# Patient Record
Sex: Female | Born: 2000 | Race: Black or African American | Hispanic: No | Marital: Single | Smoking: Former smoker
Health system: Southern US, Community
[De-identification: ages and names within clinical notes are randomized; demographics above are authoritative.]

## PROBLEM LIST (undated history)

## (undated) DIAGNOSIS — K3184 Gastroparesis: Secondary | ICD-10-CM

## (undated) DIAGNOSIS — N809 Endometriosis, unspecified: Secondary | ICD-10-CM

## (undated) DIAGNOSIS — F419 Anxiety disorder, unspecified: Secondary | ICD-10-CM

## (undated) DIAGNOSIS — R1115 Cyclical vomiting syndrome unrelated to migraine: Secondary | ICD-10-CM

## (undated) DIAGNOSIS — K219 Gastro-esophageal reflux disease without esophagitis: Secondary | ICD-10-CM

## (undated) DIAGNOSIS — F32A Depression, unspecified: Secondary | ICD-10-CM

## (undated) HISTORY — DX: Gastroparesis: K31.84

## (undated) HISTORY — DX: Cyclical vomiting syndrome unrelated to migraine: R11.15

## (undated) HISTORY — DX: Gastro-esophageal reflux disease without esophagitis: K21.9

## (undated) HISTORY — DX: Anxiety disorder, unspecified: F41.9

## (undated) HISTORY — DX: Morbid (severe) obesity due to excess calories: E66.01

## (undated) HISTORY — PX: CHOLECYSTECTOMY: SHX55

## (undated) HISTORY — DX: Endometriosis, unspecified: N80.9

## (undated) HISTORY — DX: Depression, unspecified: F32.A

---

## 2020-04-27 ENCOUNTER — Other Ambulatory Visit: Payer: Self-pay

## 2020-04-27 ENCOUNTER — Emergency Department (HOSPITAL_COMMUNITY)
Admission: EM | Admit: 2020-04-27 | Discharge: 2020-04-28 | Disposition: A | Discharge status: Home / Self Care | Payer: Medicaid Other | Attending: Emergency Medicine | Admitting: Emergency Medicine

## 2020-04-27 ENCOUNTER — Encounter (HOSPITAL_COMMUNITY): Payer: Self-pay | Admitting: Pediatrics

## 2020-04-27 DIAGNOSIS — R509 Fever, unspecified: Secondary | ICD-10-CM | POA: Diagnosis not present

## 2020-04-27 DIAGNOSIS — R5383 Other fatigue: Secondary | ICD-10-CM | POA: Diagnosis not present

## 2020-04-27 DIAGNOSIS — R112 Nausea with vomiting, unspecified: Secondary | ICD-10-CM | POA: Insufficient documentation

## 2020-04-27 LAB — URINALYSIS, ROUTINE W REFLEX MICROSCOPIC
Glucose, UA: 50 mg/dL — AB
Hgb urine dipstick: NEGATIVE
Ketones, ur: 5 mg/dL — AB
Leukocytes,Ua: NEGATIVE
Nitrite: NEGATIVE
Protein, ur: 100 mg/dL — AB
Specific Gravity, Urine: 1.03 (ref 1.005–1.030)
pH: 5 (ref 5.0–8.0)

## 2020-04-27 LAB — COMPREHENSIVE METABOLIC PANEL
ALT: 202 U/L — ABNORMAL HIGH (ref 0–44)
AST: 80 U/L — ABNORMAL HIGH (ref 15–41)
Albumin: 4.2 g/dL (ref 3.5–5.0)
Alkaline Phosphatase: 92 U/L (ref 38–126)
Anion gap: 15 (ref 5–15)
BUN: 6 mg/dL (ref 6–20)
CO2: 28 mmol/L (ref 22–32)
Calcium: 10 mg/dL (ref 8.9–10.3)
Chloride: 95 mmol/L — ABNORMAL LOW (ref 98–111)
Creatinine, Ser: 0.92 mg/dL (ref 0.44–1.00)
GFR, Estimated: >60 mL/min (ref >60)
Glucose, Bld: 120 mg/dL — ABNORMAL HIGH (ref 70–99)
Potassium: 2.9 mmol/L — ABNORMAL LOW (ref 3.5–5.1)
Sodium: 138 mmol/L (ref 135–145)
Total Bilirubin: 1.8 mg/dL — ABNORMAL HIGH (ref 0.3–1.2)
Total Protein: 8.3 g/dL — ABNORMAL HIGH (ref 6.5–8.1)

## 2020-04-27 LAB — CBC
HCT: 51.2 % — ABNORMAL HIGH (ref 36.0–46.0)
Hemoglobin: 16.8 g/dL — ABNORMAL HIGH (ref 12.0–15.0)
MCH: 31 pg (ref 26.0–34.0)
MCHC: 32.8 g/dL (ref 30.0–36.0)
MCV: 94.5 fL (ref 80.0–100.0)
Platelets: 313 10*3/uL (ref 150–400)
RBC: 5.42 MIL/uL — ABNORMAL HIGH (ref 3.87–5.11)
RDW: 13.6 % (ref 11.5–15.5)
WBC: 8.9 10*3/uL (ref 4.0–10.5)
nRBC: 0 % (ref 0.0–0.2)

## 2020-04-27 LAB — LIPASE, BLOOD: Lipase: 19 U/L (ref 11–51)

## 2020-04-27 LAB — I-STAT BETA HCG BLOOD, ED (MC, WL, AP ONLY): I-stat hCG, quantitative: <5 m[IU]/mL (ref <5)

## 2020-04-27 NOTE — ED Triage Notes (Signed)
C/o vomitingx 3 weeks, unable to keep anything even water along w/ abdominal pain.  Reported hx of cyclic vomiting, diverticulitis, gastritis, endometriosis and fibromyalgia.

## 2020-04-28 ENCOUNTER — Encounter (HOSPITAL_COMMUNITY): Payer: Self-pay | Admitting: Student

## 2020-04-28 LAB — RAPID URINE DRUG SCREEN, HOSP PERFORMED
Amphetamines: NOT DETECTED
Barbiturates: NOT DETECTED
Benzodiazepines: NOT DETECTED
Cocaine: NOT DETECTED
Opiates: NOT DETECTED
Tetrahydrocannabinol: NOT DETECTED

## 2020-04-28 LAB — MAGNESIUM: Magnesium: 2 mg/dL (ref 1.7–2.4)

## 2020-04-28 MED ORDER — SODIUM CHLORIDE 0.9 % IV BOLUS
1000.0000 mL | Freq: Once | INTRAVENOUS | Status: AC
  Administered 2020-04-28: 1000 mL via INTRAVENOUS

## 2020-04-28 MED ORDER — POTASSIUM CHLORIDE CRYS ER 20 MEQ PO TBCR
40.0000 meq | EXTENDED_RELEASE_TABLET | Freq: Once | ORAL | Status: DC
  Filled 2020-04-28: qty 2

## 2020-04-28 MED ORDER — DICYCLOMINE HCL 10 MG PO CAPS
10.0000 mg | ORAL_CAPSULE | Freq: Once | ORAL | Status: AC
  Administered 2020-04-28: 10 mg via ORAL
  Filled 2020-04-28: qty 1

## 2020-04-28 MED ORDER — ONDANSETRON HCL 4 MG/2ML IJ SOLN
4.0000 mg | Freq: Once | INTRAMUSCULAR | Status: AC
  Administered 2020-04-28: 4 mg via INTRAVENOUS
  Filled 2020-04-28: qty 2

## 2020-04-28 MED ORDER — PANTOPRAZOLE SODIUM 40 MG PO TBEC: 40.0000 mg | DELAYED_RELEASE_TABLET | Freq: Every day | ORAL | 0 refills | Status: DC

## 2020-04-28 MED ORDER — POTASSIUM CHLORIDE 40 MEQ/15ML (20%) PO SOLN
15.0000 mL | Freq: Every day | ORAL | 0 refills | Status: DC
Start: 2020-04-28 — End: 2021-03-21

## 2020-04-28 MED ORDER — FAMOTIDINE IN NACL 20-0.9 MG/50ML-% IV SOLN
20.0000 mg | Freq: Once | INTRAVENOUS | Status: AC
  Administered 2020-04-28: 20 mg via INTRAVENOUS
  Filled 2020-04-28: qty 50

## 2020-04-28 MED ORDER — POTASSIUM CHLORIDE 10 MEQ/100ML IV SOLN
10.0000 meq | INTRAVENOUS | Status: AC
  Administered 2020-04-28 (×2): 10 meq via INTRAVENOUS
  Filled 2020-04-28 (×2): qty 100

## 2020-04-28 MED ORDER — DICYCLOMINE HCL 20 MG PO TABS: 20.0000 mg | ORAL_TABLET | Freq: Three times a day (TID) | ORAL | 0 refills | Status: DC | PRN

## 2020-04-28 MED ORDER — DROPERIDOL 2.5 MG/ML IJ SOLN
1.2500 mg | Freq: Once | INTRAMUSCULAR | Status: AC
  Administered 2020-04-28: 1.25 mg via INTRAVENOUS
  Filled 2020-04-28: qty 2

## 2020-04-28 MED ORDER — SUCRALFATE 1 GM/10ML PO SUSP: 1.0000 g | Freq: Three times a day (TID) | ORAL | 0 refills | Status: DC

## 2020-04-28 NOTE — ED Notes (Signed)
Pt was given a cup of ice water she only took a few sips before dry heaving and states she is not able to drink anymore water at this time.

## 2020-04-28 NOTE — ED Provider Notes (Signed)
[facility] EMERGENCY DEPARTMENT Provider Note   CSN: [id number] Arrival date & time: 04/27/20  1744     History Chief Complaint  Patient presents with  . Emesis    Glenda Kim is a 19 y.o. female with a history of cyclic vomiting syndrome, gastritis, endometriosis, and fibromyalgia who presents to the ED with complaints of N/V x 3 weeks. Patient states she has had daily vomiting x 3 weeks, unable to keep down food, keeping down some liquids.  Reports associated abdominal pain, fatigue, subjective fevers, & chills. Pain is located in the right side of the abdomen, feels like spasms that are alleviated some with cool liquid intake, otherwise without alleviating or aggravating factors. Has been trying zofran & reglan without much relief. Last BM was 4 days prior. Denies hematemesis, melena, hematochezia, dysuria, vaginal bleeding, or vaginal discharge.  She states she has a history of similar symptoms, she has required admission in the past.  She states she has  had her gallbladder removed and has had multiple endoscopies.  She denies marijuana use.  Her symptoms feel similar to her most recent ED visit at [facility] 04/21/2020.  She states that usually fluids and IV Zofran help her sxs.    HPI     History reviewed. No pertinent past medical history.  There are no problems to display for this patient.   History reviewed. No pertinent surgical history.   OB History   No obstetric history on file.     No family history on file.  Social History   Tobacco Use  . Smoking status: Not on file  Substance Use Topics  . Alcohol use: Not on file  . Drug use: Not on file    Home Medications Prior to Admission medications   Not on File    Allergies    Patient has no known allergies.  Review of Systems   Review of Systems  Constitutional: Positive for chills, fatigue and fever.  Respiratory: Negative for shortness of breath.   Cardiovascular: Negative for  chest pain.  Gastrointestinal: Positive for abdominal pain, nausea and vomiting. Negative for blood in stool and constipation.  Genitourinary: Negative for dysuria.  Neurological: Negative for syncope.  All other systems reviewed and are negative.   Physical Exam Updated Vital Signs BP 139/62 (BP Location: Right Arm)   Pulse 99   Temp 98.5 F (36.9 C) (Oral)   Resp 18   SpO2 100%   Physical Exam Vitals and nursing note reviewed.  Constitutional:      General: She is not in acute distress.    Appearance: She is well-developed. She is obese. She is not toxic-appearing.  HENT:     Head: Normocephalic and atraumatic.  Eyes:     General:        Right eye: No discharge.        Left eye: No discharge.     Conjunctiva/sclera: Conjunctivae normal.  Cardiovascular:     Rate and Rhythm: Normal rate and regular rhythm.  Pulmonary:     Effort: Pulmonary effort is normal. No respiratory distress.     Breath sounds: Normal breath sounds. No wheezing, rhonchi or rales.  Abdominal:     General: There is no distension.     Palpations: Abdomen is soft.     Tenderness: There is no abdominal tenderness. There is no guarding.     Comments: Generalized worse in the RUQ  Musculoskeletal:     Cervical back: Neck supple.  Skin:    General: Skin is warm and dry.     Findings: No rash.  Neurological:     Mental Status: She is alert.     Comments: Clear speech.   Psychiatric:        Behavior: Behavior normal.    ED Results / Procedures / Treatments   Labs (all labs ordered are listed, but only abnormal results are displayed) Labs Reviewed  COMPREHENSIVE METABOLIC PANEL - Abnormal; Notable for the following components:      Result Value   Potassium 2.9 (*)    Chloride 95 (*)    Glucose, Bld 120 (*)    Total Protein 8.3 (*)    AST 80 (*)    ALT 202 (*)    Total Bilirubin 1.8 (*)    All other components within normal limits  CBC - Abnormal; Notable for the following components:   RBC  5.42 (*)    Hemoglobin 16.8 (*)    HCT 51.2 (*)    All other components within normal limits  URINALYSIS, ROUTINE W REFLEX MICROSCOPIC - Abnormal; Notable for the following components:   Color, Urine AMBER (*)    APPearance TURBID (*)    Glucose, UA 50 (*)    Bilirubin Urine MODERATE (*)    Ketones, ur 5 (*)    Protein, ur 100 (*)    Bacteria, UA RARE (*)    All other components within normal limits  LIPASE, BLOOD  MAGNESIUM  RAPID URINE DRUG SCREEN, HOSP PERFORMED  I-STAT BETA HCG BLOOD, ED ([facility], [facility], [facility] ONLY)    EKG EKG Interpretation  Date/Time:  Wednesday April 28 2020 02:38:30 EDT Ventricular Rate:  78 PR Interval:    QRS Duration: 91 QT Interval:  390 QTC Calculation: 445 R Axis:   54 Text Interpretation: Sinus rhythm Consider left atrial enlargement No old tracing to compare Confirmed by Melene Plan [phone]) on 04/28/2020 3:03:51 AM   Radiology No results found.  Procedures Procedures (including critical care time)  Medications Ordered in ED Medications - No data to display  ED Course  I have reviewed the triage vital signs and the nursing notes.  Pertinent labs & imaging results that were available during my care of the patient were reviewed by me and considered in my medical decision making (see chart for details).    MDM Rules/Calculators/A&P                         Patient presents to the ED with complaints of N/V & abdominal pain x 3 weeks.  Patient is nontoxic, her initial tachycardia normalized on my assessment.  She has generalized abdominal tenderness that seems most prominent in the right upper quadrant.  Additional history obtained:  Additional history obtained from chart review nursing note reviewed.. Previous records obtained and reviewed:  Per chart review patient seen at [facility] emergency department 03/2006/21 with similar symptoms, had a CT abdomen/pelvis with contrast that did not show any acute process.  She was also seen in the [facility]  emergency department in October as well-having some difficulty reviewing these specific records.  The last gastroenterology note in care everywhere appears to be from July 2020, this was a follow-up after recent admission for cyclic vomiting syndrome, patient has documented gastroparesis with 0% of her stomach contents of doing after 90 minutes.  She had an EGD about a year prior to this visit that showed severe esophagitis with comorbid gastritis.  Lab Tests:  I reviewed and interpreted labs, which included:  CBC: Fairly unremarkable CMP: Transaminitis and mild elevation in total bilirubin which are mildly worsened compared to prior but have been elevated in the past.  Hypokalemia at 2.9. Lipase: Within normal limits Urinalysis: Contaminated, no urinary symptoms, doubt UTI. UDS: Negative Pregnancy test: Negative Magnesium: Within normal limits  EKG: QTC 445  There was some concern from patient's significant other that she was having a seizure, patient was dry heaving some with increased salivation and appeared a bit shaky, no witnessed seizure activity.  Given patient with same symptoms with recent CT imaging and no peritoneal signs today do not feel that further imaging is necessary in the emergency department at this time. Initially given fluids, Zofran, and Pepcid with improvement in her symptoms.  Still feels nauseated.  Droperidol given subsequently able to tolerate p.o.  Will discharge home with PPI, Carafate, and Bentyl as patient is not currently taking these medications.  She still has Zofran, Phenergan, and Reglan to take in terms of antiemetics.  We discussed diet recommendations.  Potassium supplement to go home with as well.  GI information provided for follow-up. I discussed results, treatment plan, need for follow-up, and return precautions with the patient. Provided opportunity for questions, patient confirmed understanding and is in agreement with plan.   Findings and plan of  care discussed with supervising physician Dr. Adela Lank who is in agreement.   Portions of this note were generated with Scientist, clinical (histocompatibility and immunogenetics). Dictation errors may occur despite best attempts at proofreading.  Final Clinical Impression(s) / ED Diagnoses Final diagnoses:  Non-intractable vomiting with nausea, unspecified vomiting type    Rx / DC Orders ED Discharge Orders         Ordered    dicyclomine (BENTYL) 20 MG tablet  Every 8 hours PRN        04/28/20 0600    sucralfate (CARAFATE) 1 GM/10ML suspension  3 times daily with meals & bedtime        04/28/20 0600    pantoprazole (PROTONIX) 40 MG tablet  Daily        04/28/20 0600    Potassium Chloride 40 MEQ/15ML (20%) SOLN  Daily        04/28/20 0602           Waino Mounsey, Pleas Koch, PA-C 04/28/20 0643    Melene Plan, DO 04/28/20 [phone]

## 2020-04-28 NOTE — ED Notes (Signed)
Pts visitor yelling for help stating that the pt looked like she was having a seizure and foaming out of her mouth, this NT entered the room to find pt lying in the bed jerking and breathing rapidly also had what appeared to be saliva around her mouth, pt stated "I can't throw up, I can't throw up". Pt assisted up into bed and given emesis bag. RN notified.

## 2020-04-28 NOTE — ED Notes (Signed)
Pt resting with eyes closed at this time.  Friend at bedside

## 2020-04-28 NOTE — Discharge Instructions (Addendum)
You were seen in the ER today for nausea, vomiting, and abdominal pain.  Your blood work looks fairly similar to prior blood work you have had done with a low potassium level and elevated liver function test.  Please be sure to have these rechecked by a primary care provider or your GI provider within the next 1 week.  Please continue your nausea medication at home.  We are also sending you home with the following medicines:We are sending you home with the following medications to help with your symptoms:  - Protonix- please take 1 tablet in the morning prior to any meals to help with stomach acidity/pain.  - Carafate- please take prior to each meal and prior to bedtime to help with stomach acidity/pain.  -Bentyl: Please take this every 8 hours as needed for abdominal cramping/spasms -Potassium: Please take 15 mL/day for the next 4 days to help with your low potassium levels.  Please also see attached diet guidelines.  We have prescribed you new medication(s) today. Discuss the medications prescribed today with your pharmacist as they can have adverse effects and interactions with your other medicines including over the counter and prescribed medications. Seek medical evaluation if you start to experience new or abnormal symptoms after taking one of these medicines, seek care immediately if you start to experience difficulty breathing, feeling of your throat closing, facial swelling, or rash as these could be indications of a more serious allergic reaction  Please follow attached diet guidelines.   Follow up with your primary care provider or with gastroenterology within 3 days for re-evaluation.  Please call today to make soonest available appointment. Return to the ER for new or worsening symptoms including but not limited to worsened pain, new pain, inability to keep fluids down, blood in vomit/stool, passing out, or any other concerns.

## 2021-02-02 ENCOUNTER — Ambulatory Visit: Payer: Medicaid Other | Admitting: Family Medicine

## 2021-02-02 NOTE — Progress Notes (Deleted)
    SUBJECTIVE:   CHIEF COMPLAINT / HPI: establish care  20 yo woman presents today to establish care  PMH: cyclical vomiting syndrome, esophagitis, gastritis, gastroparesis, morbid obesity, PCOS PSH: EGD Meds:  Allergies: Social hx: Family Hx:  Healthcare maintenance: patient has had pap this year- NILM, non-reactive screening HIV in 2019.  PERTINENT  PMH / PSH: see above  OBJECTIVE:   There were no vitals taken for this visit.  Nursing note and vitals reviewed GEN: *** resting comfortably in chair, NAD, WNWD HEENT: NCAT. PERRLA. Sclera without injection or icterus. MMM.  Neck: Supple.  Cardiac: Regular rate and rhythm. Normal S1/S2. No murmurs, rubs, or gallops appreciated. 2+ radial pulses. Lungs: Clear bilaterally to ascultation. No increased WOB, no accessory muscle usage. No w/r/r. Abdomen: Normoactive bowel sounds. No tenderness to deep or light palpation. No rebound or guarding.  ***  Neuro: AOx3 *** Ext: no edema Psych: Pleasant and appropriate    ASSESSMENT/PLAN:   No problem-specific Assessment & Plan notes found for this encounter.     Shirlean Mylar, MD [facility]

## 2021-03-21 ENCOUNTER — Ambulatory Visit: Payer: Medicaid Other | Admitting: Nurse Practitioner

## 2021-03-21 ENCOUNTER — Other Ambulatory Visit: Payer: Self-pay

## 2021-03-21 ENCOUNTER — Encounter: Payer: Self-pay | Admitting: Nurse Practitioner

## 2021-03-21 VITALS — BP 120/78 | HR 68 | Temp 98.5°F | Ht 66.6 in | Wt >= 6400 oz

## 2021-03-21 DIAGNOSIS — Z1159 Encounter for screening for other viral diseases: Secondary | ICD-10-CM

## 2021-03-21 DIAGNOSIS — E611 Iron deficiency: Secondary | ICD-10-CM

## 2021-03-21 DIAGNOSIS — K3184 Gastroparesis: Secondary | ICD-10-CM

## 2021-03-21 DIAGNOSIS — Z7689 Persons encountering health services in other specified circumstances: Secondary | ICD-10-CM

## 2021-03-21 DIAGNOSIS — R635 Abnormal weight gain: Secondary | ICD-10-CM | POA: Diagnosis not present

## 2021-03-21 DIAGNOSIS — R1115 Cyclical vomiting syndrome unrelated to migraine: Secondary | ICD-10-CM | POA: Insufficient documentation

## 2021-03-21 DIAGNOSIS — F331 Major depressive disorder, recurrent, moderate: Secondary | ICD-10-CM

## 2021-03-21 DIAGNOSIS — Z6841 Body Mass Index (BMI) 40.0 and over, adult: Secondary | ICD-10-CM

## 2021-03-21 DIAGNOSIS — R0683 Snoring: Secondary | ICD-10-CM

## 2021-03-21 NOTE — Patient Instructions (Signed)

## 2021-03-21 NOTE — Progress Notes (Signed)
I,Tianna Badgett,acting as a Education administrator for [employer], FNP.,have documented all relevant documentation on the behalf of Minette Brine, FNP,as directed by  Minette Brine, FNP while in the presence of Minette Brine, [place].  This visit occurred during the SARS-CoV-2 public health emergency.  Safety protocols were in place, including screening questions prior to the visit, additional usage of staff PPE, and extensive cleaning of exam room while observing appropriate contact time as indicated for disinfecting solutions.  Subjective:     Patient ID: Glenda Kim , female    DOB: [birth year] , 20 y.o.   MRN: [id number]   Chief Complaint  Patient presents with   Establish Care    HPI  Here to establish care. Was being followed at [facility]. She relocated to live with her girlfriend (partner). Patient would like to be sent to specialist for stomach issues. She would like to discuss her weight gain as well. Working as a Presenter, broadcasting. No children.   PMH - vomiting, gastroparesis (since age 89) has worsened over time. She feels she is managing better., Endometriosis and PCOS (she was being followed by GYN in [place]), she has tried birth control pills. She has an irregular menstrual cycle. Hx of asthma. She reports a history of underactive thyroid as a child.    [facility] - Father - prostate cancer with metastasis (21), uncle with heart disease. Maternal grandfather pancreatitic cancer mother - depression and anxiety, maternal grandmother with IBS and Chron's disease.    Wt Readings from Last 3 Encounters: 03/21/21 : (!) 427 lb (193.7 kg)  Ages [age] y/o gained 150 lbs, she did take long term prednisone for about one year from ages [age] y/o. Since age 58 y/o she was 337 lbs would fluctuate to 420's. She is eating vegetarian and not eating as much meat. She only eats one healthy meal a day but will snack. Does not drink sodas but will drink juice from time to time. She does eat oranges and carrots  but will eat chips and cookies as well.   She has been diagnosed with depression and PTSD. She has tried wellbutrin but this medication made her "angry". Denies homicidal or suicidal ideations, never been hospitalized at behavioral health.  She is adopted and shared her birth parents health history.      Past Medical History:  Diagnosis Date   Gastroparesis    GERD (gastroesophageal reflux disease)      Family History  Problem Relation Age of Onset   Depression Mother    Anxiety disorder Mother    Heart disease Father    Cancer Father    Healthy Sister    Depression Brother    Anxiety disorder Brother    Irritable bowel syndrome Maternal Grandmother    Crohn's disease Maternal Grandmother    Cancer Maternal Grandfather    Hypertension Paternal Grandmother      Current Outpatient Medications:    metoCLOPramide (REGLAN) 10 MG tablet, Take by mouth., Disp: , Rfl:    sucralfate (CARAFATE) 1 GM/10ML suspension, Take 1 mL by mouth 4 (four) times daily -  before meals and at bedtime., Disp: , Rfl:    Allergies  Allergen Reactions   Shellfish Allergy Anaphylaxis     Review of Systems  Constitutional: Negative.   Respiratory: Negative.    Cardiovascular: Negative.  Negative for chest pain, palpitations and leg swelling.  Gastrointestinal: Negative.   Neurological: Negative.     Today's Vitals   03/21/21 1516  BP: 120/78  Pulse: 68  Temp: 98.5 F (36.9 C)  TempSrc: Oral  Weight: (!) 427 lb (193.7 kg)  Height: 5' 6.6" (1.692 m)   Body mass index is 67.68 kg/m.  Wt Readings from Last 3 Encounters:  03/21/21 (!) 427 lb (193.7 kg)    Objective:  Physical Exam Vitals reviewed.  Constitutional:      General: She is not in acute distress.    Appearance: Normal appearance. She is well-developed. She is obese.  Cardiovascular:     Rate and Rhythm: Normal rate and regular rhythm.     Pulses: Normal pulses.     Heart sounds: Normal heart sounds. No murmur  heard. Pulmonary:     Effort: Pulmonary effort is normal. No respiratory distress.     Breath sounds: Normal breath sounds.  Chest:     Chest wall: No tenderness.  Musculoskeletal:        General: Normal range of motion.  Skin:    General: Skin is warm and dry.     Capillary Refill: Capillary refill takes less than 2 seconds.  Neurological:     General: No focal deficit present.     Mental Status: She is alert and oriented to person, place, and time.     Cranial Nerves: No cranial nerve deficit.  Psychiatric:        Mood and Affect: Mood normal.        Behavior: Behavior normal.        Thought Content: Thought content normal.        Judgment: Judgment normal.        Assessment And Plan:     1. Abnormal weight gain Comments: Will check for metabolic causes - Hemoglobin A1c - CMP14+EGFR - Insulin, random - TSH - Thyroid Peroxidase Antibody  2. Morbid obesity with BMI of 60.0-69.9, adult (HCC) Chronic Discussed healthy diet and regular exercise options  Encouraged to exercise at least 150 minutes per week with 2 days of strength training She is encouraged to strive for BMI less than 30 to decrease cardiac risk. Advised to aim for at least 150 minutes of exercise per week. - Ambulatory referral to Sleep Studies  3. Moderate episode of recurrent major depressive disorder [facility]) She feels she needs more consistent treatment by a specialist, will refer to psychiatry - Ambulatory referral to Psychiatry  4. Low iron Comments: Has had low iron in the past and with her ongoing fatigue will check levels - CBC - Iron, TIBC and Ferritin Panel  5. Snoring Comments: Will refer for sleep study for further evaluation.  If has sleep apnea this can affect her weight - Ambulatory referral to Sleep Studies  6. Gastroparesis Comments: This is long term, will refer to GI to get established - Ambulatory referral to Gastroenterology  7. Encounter to establish care  8. Encounter for  hepatitis C screening test for low risk patient Will check Hepatitis C screening due to recent recommendations to screen all adults 18 years and older - Hepatitis C antibody    Patient was given opportunity to ask questions. Patient verbalized understanding of the plan and was able to repeat key elements of the plan. All questions were answered to their satisfaction.  Minette Brine, FNP   I, Minette Brine, FNP, have reviewed all documentation for this visit. The documentation on 03/25/21 for the exam, diagnosis, procedures, and orders are all accurate and complete.   IF YOU HAVE BEEN REFERRED TO A SPECIALIST, IT MAY TAKE 1-2 WEEKS TO SCHEDULE/PROCESS  THE REFERRAL. IF YOU HAVE NOT HEARD FROM US/SPECIALIST IN TWO WEEKS, PLEASE GIVE [place] A CALL AT [phone].   THE PATIENT IS ENCOURAGED TO PRACTICE SOCIAL DISTANCING DUE TO THE COVID-19 PANDEMIC.

## 2021-03-22 LAB — CMP14+EGFR
ALT: 17 IU/L (ref 0–32)
AST: 15 IU/L (ref 0–40)
Albumin/Globulin Ratio: 1.7 (ref 1.2–2.2)
Albumin: 4.3 g/dL (ref 3.9–5.0)
Alkaline Phosphatase: 97 IU/L (ref 44–121)
BUN/Creatinine Ratio: 13 (ref 9–23)
BUN: 9 mg/dL (ref 6–20)
Bilirubin Total: 0.3 mg/dL (ref 0.0–1.2)
CO2: 23 mmol/L (ref 20–29)
Calcium: 9.5 mg/dL (ref 8.7–10.2)
Chloride: 101 mmol/L (ref 96–106)
Creatinine, Ser: 0.69 mg/dL (ref 0.57–1.00)
Globulin, Total: 2.6 g/dL (ref 1.5–4.5)
Glucose: 85 mg/dL (ref 70–99)
Potassium: 4.4 mmol/L (ref 3.5–5.2)
Sodium: 140 mmol/L (ref 134–144)
Total Protein: 6.9 g/dL (ref 6.0–8.5)
eGFR: 126 mL/min/{1.73_m2} (ref >59)

## 2021-03-22 LAB — IRON,TIBC AND FERRITIN PANEL
Ferritin: 49 ng/mL (ref 15–150)
Iron Saturation: 33 % (ref 15–55)
Iron: 98 ug/dL (ref 27–159)
Total Iron Binding Capacity: 301 ug/dL (ref 250–450)
UIBC: 203 ug/dL (ref 131–425)

## 2021-03-22 LAB — HEPATITIS C ANTIBODY: Hep C Virus Ab: 0.1 s/co ratio (ref 0.0–0.9)

## 2021-03-22 LAB — TSH: TSH: 2.85 u[IU]/mL (ref 0.450–4.500)

## 2021-03-22 LAB — CBC
Hematocrit: 42.5 % (ref 34.0–46.6)
Hemoglobin: 13.9 g/dL (ref 11.1–15.9)
MCH: 30.5 pg (ref 26.6–33.0)
MCHC: 32.7 g/dL (ref 31.5–35.7)
MCV: 93 fL (ref 79–97)
Platelets: 282 10*3/uL (ref 150–450)
RBC: 4.55 x10E6/uL (ref 3.77–5.28)
RDW: 13.3 % (ref 11.7–15.4)
WBC: 10 10*3/uL (ref 3.4–10.8)

## 2021-03-22 LAB — HEMOGLOBIN A1C
Est. average glucose Bld gHb Est-mCnc: 120 mg/dL
Hgb A1c MFr Bld: 5.8 % — ABNORMAL HIGH (ref 4.8–5.6)

## 2021-03-22 LAB — INSULIN, RANDOM: INSULIN: 43.3 u[IU]/mL — ABNORMAL HIGH (ref 2.6–24.9)

## 2021-03-22 LAB — THYROID PEROXIDASE ANTIBODY: Thyroperoxidase Ab SerPl-aCnc: <8 IU/mL (ref 0–34)

## 2021-03-31 ENCOUNTER — Other Ambulatory Visit: Payer: Self-pay

## 2021-03-31 MED ORDER — METFORMIN HCL 500 MG PO TABS: 500.0000 mg | ORAL_TABLET | Freq: Two times a day (BID) | ORAL | 11 refills | Status: DC

## 2021-04-29 ENCOUNTER — Encounter: Payer: Self-pay | Admitting: Nurse Practitioner

## 2021-05-03 ENCOUNTER — Encounter: Payer: Medicaid Other | Admitting: Nurse Practitioner

## 2021-05-03 NOTE — Patient Instructions (Signed)

## 2021-05-03 NOTE — Progress Notes (Signed)
Patient not seen.

## 2021-06-01 ENCOUNTER — Encounter: Payer: Medicaid Other | Admitting: Nurse Practitioner

## 2021-06-01 NOTE — Progress Notes (Signed)
  This visit occurred during the SARS-CoV-2 public health emergency.  Safety protocols were in place, including screening questions prior to the visit, additional usage of staff PPE, and extensive cleaning of exam room while observing appropriate contact time as indicated for disinfecting solutions.  Subjective:     Patient ID: Glenda Kim , female    DOB: 02-Jan-2001 , 20 y.o.   MRN: [id number]   Chief Complaint  Patient presents with   Weight Check    HPI  Patient presents today for weight check    Past Medical History:  Diagnosis Date   Gastroparesis    GERD (gastroesophageal reflux disease)      Family History  Problem Relation Age of Onset   Depression Mother    Anxiety disorder Mother    Heart disease Father    Cancer Father    Healthy Sister    Depression Brother    Anxiety disorder Brother    Irritable bowel syndrome Maternal Grandmother    Crohn's disease Maternal Grandmother    Cancer Maternal Grandfather    Hypertension Paternal Grandmother      Current Outpatient Medications:    metFORMIN (GLUCOPHAGE) 500 MG tablet, Take 1 tablet (500 mg total) by mouth 2 (two) times daily with a meal., Disp: 60 tablet, Rfl: 11   metoCLOPramide (REGLAN) 10 MG tablet, Take by mouth., Disp: , Rfl:    sucralfate (CARAFATE) 1 GM/10ML suspension, Take 1 mL by mouth 4 (four) times daily -  before meals and at bedtime., Disp: , Rfl:    Allergies  Allergen Reactions   Shellfish Allergy Anaphylaxis     Review of Systems   There were no vitals filed for this visit. There is no height or weight on file to calculate BMI.   Objective:  Physical Exam      Assessment And Plan:     There are no diagnoses linked to this encounter.    Patient was given opportunity to ask questions. Patient verbalized understanding of the plan and was able to repeat key elements of the plan. All questions were answered to their satisfaction.  Patrecia Pour [place], CMA   I, Patrecia Pour  [place], CMA, have reviewed all documentation for this visit. The documentation on 06/01/21 for the exam, diagnosis, procedures, and orders are all accurate and complete.   IF YOU HAVE BEEN REFERRED TO A SPECIALIST, IT MAY TAKE 1-2 WEEKS TO SCHEDULE/PROCESS THE REFERRAL. IF YOU HAVE NOT HEARD FROM US/SPECIALIST IN TWO WEEKS, PLEASE GIVE [place] A CALL AT [phone].   THE PATIENT IS ENCOURAGED TO PRACTICE SOCIAL DISTANCING DUE TO THE COVID-19 PANDEMIC.

## 2021-06-21 ENCOUNTER — Ambulatory Visit: Payer: Medicaid Other | Admitting: Nurse Practitioner

## 2021-06-30 ENCOUNTER — Encounter: Payer: Self-pay | Admitting: Neurology

## 2021-06-30 ENCOUNTER — Other Ambulatory Visit: Payer: Self-pay

## 2021-06-30 ENCOUNTER — Ambulatory Visit: Payer: Medicaid Other | Admitting: Neurology

## 2021-06-30 VITALS — BP 121/70 | HR 71 | Ht 66.6 in | Wt >= 6400 oz

## 2021-06-30 DIAGNOSIS — G4753 Recurrent isolated sleep paralysis: Secondary | ICD-10-CM

## 2021-06-30 DIAGNOSIS — G44001 Cluster headache syndrome, unspecified, intractable: Secondary | ICD-10-CM | POA: Diagnosis not present

## 2021-06-30 DIAGNOSIS — E662 Morbid (severe) obesity with alveolar hypoventilation: Secondary | ICD-10-CM | POA: Diagnosis not present

## 2021-06-30 DIAGNOSIS — G43809 Other migraine, not intractable, without status migrainosus: Secondary | ICD-10-CM

## 2021-06-30 DIAGNOSIS — G4719 Other hypersomnia: Secondary | ICD-10-CM | POA: Diagnosis not present

## 2021-06-30 DIAGNOSIS — G4700 Insomnia, unspecified: Secondary | ICD-10-CM

## 2021-06-30 DIAGNOSIS — H8101 Meniere's disease, right ear: Secondary | ICD-10-CM

## 2021-06-30 DIAGNOSIS — G932 Benign intracranial hypertension: Secondary | ICD-10-CM | POA: Diagnosis not present

## 2021-06-30 DIAGNOSIS — Z6841 Body Mass Index (BMI) 40.0 and over, adult: Secondary | ICD-10-CM

## 2021-06-30 DIAGNOSIS — G4485 Primary stabbing headache: Secondary | ICD-10-CM

## 2021-06-30 MED ORDER — TRAZODONE HCL 50 MG PO TABS: 50.0000 mg | ORAL_TABLET | Freq: Every evening | ORAL | 1 refills | Status: DC | PRN

## 2021-06-30 NOTE — Progress Notes (Signed)
SLEEP MEDICINE CLINIC    Provider:  Melvyn Novas, MD  Primary Care Physician:  Arnette Felts, FNP [address] [place] [address]     Referring Provider: Arnette Felts, Fnp [address] [place],  [place] [address]          Chief Complaint according to patient   Patient presents with:     New Patient (Initial Visit)     snoring with morbid obesity, BMI of 60-69.9. last HST done on 07/28/20. Pt reports GF had witnessed her stop breathing at night, heavy snoring and wakes herself up at night. Pt is more of a stomach and side sleeper. Works 12hr shift. Avg sleep 4-5 hrs at most. Has trouble falling asleep and waking every night at 3am.       HISTORY OF PRESENT ILLNESS:  Glenda Kim is a 21 y.o. American female patient and seen on 06/30/2021 from Arnette Felts, FNP,  for a Sleep consultation.  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 She achieved regular menstrual cycles for a while but now they are irregular again.  She is taking hydroxyzine 25 mg as needed rizatriptan for frequent migrainous headaches and she can take up to 2 a day of 10 mg.   She is currently not on sucralfate but has been at the for a while Reglan was given for a while for gastroparesis and she is no longer taking metformin.  Metformin was initiated to control PCOS effect.  Her family practice ordered a sleep study the interpreting physician was Dr. Delynn Flavin school, MD study date was Feb 2nd, 2022 , she was tested by home sleep test and the test showed 16 apneas 7 mixed, 6  obstructive, 3 centrals and 22 hypopneas.  The AHI for hour of sleep was presumed to be 3.6/h.  Oxygen saturation remained stable longest time and oxygen desaturation was only 3/32 total time of 1.7 minutes.  Mean heart rate 77 bpm, so they did not find a significant amount of apnea.  Looking at the device that was used on this is not specified here I could see that the home sleep test device could not distinguish between REM and non-REM sleep and I wonder if the home sleep test could distinguish between sleep and just recording time.  The patient states that usually she gets just 4 to 6 hours of sleep and that she doubts the validity of the study for that reason.  We will definitely have to evaluate her with a different equipment.   Glenda Kim  has a past medical history of Anxiety, Cyclic vomiting syndrome, Depression, Endometriosis, Gastroparesis, PCOS with insulin restistance, Super obesity, and GERD (gastroesophageal reflux disease). Labs reviewed. 02-2021    Sleep relevant medical history: Daily headaches- weekends and work days, any time, cut down coffee - no changes. Pressure behind the eye, stabbing pain behind the eye. Sharp, nausea. Dizziness, Vertigo.  Cluster , waking her up out of sleep , she feels hot and becomes diaphoretic. Nocturia 2 -3 times - fragmented sleep.  Long periods of wakefulness at night. Sleep  talking Startling dreams 1/ month, hypothyroidism. Not diabetic.  Allergic seasonal  rhinitis.   Family medical /sleep history: brother and biological mother CPAP with OSA. She was raised by a paternal aunt.    Social history:  Patient is working as shiftSocial worker ,7 Am to 7 Pm , 5 days a week.  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   and lives in a household with GF and a dog.  Tobacco use: none .  ETOH use less than 1/ week,  Caffeine intake in form of Coffee( 1 day) . Regular exercise in form of walking, patrol.  Hobbies : barber     Sleep habits are  as follows: The patient's dinner time is between variable times- snack often in PM. The patient goes to bed at 10.30 PM, but is not asleep until midnight. Cool and quiet bedroom.   After midnight she continues to sleep for 1-2 hours, wakes for unknown reasons, may go to  bathroom.  She may get 4-6 hours total.   The preferred sleep position is sideways, left side-, with the support of 2 pillows.  Dreams are reportedly frequent/vivid.  6.15 AM is the usual rise time. The patient wakes up with an alarm. At 5.40 AM   She reports not feeling refreshed or restored in AM, with symptoms such as dry mouth, epistaxis, morning headaches, and residual fatigue.  Naps are taken infrequently, her work does not permit breaks, she feels less fatigued after 1 PM. She eats at 11.30-12 noon, but is not tired!  Naps on weekends lasting from 1-3 hours minutes and are more refreshing than nocturnal sleep. They affect night time sleep.    Review of Systems: Out of a complete 14 system review, the patient complains of only the following symptoms, and all other reviewed systems are negative.:  Fatigue, sleepiness , snoring, fragmented sleep, Insomnia witnessed apnea, sighs.   Daily headaches- weekends and work days, any time, cut down coffee - no changes. Pressure behind the eye, stabbing pain behind the eye. Sharp, nausea. Dizziness, Vertigo.  Cluster , waking her up out of sleep , she feels hot and becomes diaphoretic.    How likely are you to doze in the following situations: 0 = not likely, 1 = slight chance, 2 = moderate chance, 3 = high chance   Sitting and Reading? Watching Television? Sitting inactive in a public place (theater or meeting)? As a passenger in a car for an hour without a break? Lying down in the afternoon when circumstances permit? Sitting and talking to someone? Sitting quietly after lunch without alcohol? In a car, while stopped for a few minutes in traffic?   Total = 12/ 24 points    FSS endorsed at 63/ 63 points.   Social History   Socioeconomic History   Marital status: Single    Spouse name: Not on file   Number of children: 0   Years of education: Not on file   Highest education level: High school graduate  Occupational History   Not on file  Tobacco Use   Smoking status: Never   Smokeless tobacco: Never  Vaping Use   Vaping Use: Former  Substance and Sexual Activity   Alcohol use: Yes    Comment: socially   Drug use: Not Currently   Sexual activity: Yes    Birth control/protection: None  Other Topics Concern   Not on file  Social History Narrative   Lives with partner  Right handed   Caffeine: stopped drinking coffee begging of year, drinks decaf tea and juice   Social Determinants of Health   Financial Resource Strain: Not on file  Food Insecurity: Not on file  Transportation Needs: Not on file  Physical Activity: Not on file  Stress: Not on file  Social Connections: Not on file    Family History  Problem Relation Age of Onset   Depression Mother    Anxiety disorder Mother    Heart disease Father    Cancer Father    Healthy Sister    Depression Brother    Anxiety disorder Brother    Irritable bowel syndrome Maternal Grandmother    Crohn's disease Maternal Grandmother    Cancer Maternal Grandfather    Hypertension Paternal Grandmother     Past Medical History:  Diagnosis Date   Anxiety    Cyclic vomiting syndrome    Depression    Endometriosis    Gastroparesis    GERD (gastroesophageal reflux disease)     Past Surgical History:  Procedure Laterality Date   CHOLECYSTECTOMY       Current Outpatient Medications on File Prior to Visit  Medication Sig Dispense Refill   hydrOXYzine (ATARAX) 25 MG tablet Take 0.5-1 tablets by mouth as needed.     metoCLOPramide (REGLAN) 10 MG tablet Take by mouth.     rizatriptan (MAXALT) 10 MG tablet Take 1 tablet by mouth daily. as needed for Migraine. May repeat in 2 hours if needed      sucralfate (CARAFATE) 1 GM/10ML suspension Take 1 mL by mouth 4 (four) times daily -  before meals and at bedtime. (Patient not taking: Reported on 06/30/2021)     No current facility-administered medications on file prior to visit.    Allergies  Allergen Reactions   Shellfish Allergy Anaphylaxis    Physical exam:  Today's Vitals   06/30/21 1119  BP: 121/70  Pulse: 71  Weight: (!) 437 lb 8 oz (198.4 kg)  Height: 5' 6.6" (1.692 m)   Body mass index is 69.35 kg/m.   Wt Readings from Last 3 Encounters:  06/30/21 (!) 437 lb 8 oz (198.4 kg)  03/21/21 (!) 427 lb (193.7 kg)     Ht Readings from Last 3 Encounters:  06/30/21 5' 6.6" (1.692 m)  03/21/21 5' 6.6" (1.692 m)      General: The patient is awake, alert and appears not in acute distress. The patient is well groomed. Head: Normocephalic, atraumatic. Neck is supple. Mallampati 1, open ! neck circumference:16 inches . Nasal airflow not fully patent.   Retrognathia is mild .  Dental status: small jaw crowded dentition.  Cardiovascular:  Regular rate and cardiac rhythm by pulse,  without distended neck veins. Respiratory: Lungs are clear to auscultation.  Skin:  Without evidence of ankle edema, or rash. Trunk: The patient's posture is erect.   Neurologic exam : The patient is awake and alert, oriented to place and time.   Memory subjective described as intact.  Attention span & concentration ability appears normal.  Speech is fluent,  without  dysarthria, dysphonia or aphasia.  Mood and affect are appropriate.   Cranial nerves: no loss of smell or taste reported  Pupils are equal and briskly reactive to light. Funduscopic exam reveals palor ? pressure on papilla nervi optici.Marland Kitchen.  Extraocular movements in vertical and horizontal planes were intact and without nystagmus.  No Diplopia. Visual fields by finger perimetry are restricted on the right . Hearing was intact  to soft voice and finger rubbing.    Facial sensation  intact to fine touch. Facial motor strength is symmetric and tongue and uvula move midline.  Neck ROM : rotation, tilt and flexion extension were normal for age and shoulder shrug was symmetrical.    Motor exam:  Symmetric bulk, tone and ROM.   Normal tone without cog- wheeling, symmetric grip strength .   Sensory:  Fine touch, pinprick and vibration were  normal.  Proprioception tested in the upper extremities was normal. She reports numbness in the hands and sometimes around the lips.mouth.    Coordination: Rapid alternating movements in the fingers/hands were of normal speed.  The Finger-to-nose maneuver was intact without evidence of ataxia, dysmetria or tremor.   Gait and station: Patient could rise unassisted from a seated position, walked without assistive device.  Stance is of wider base and the patient turned with 4 steps.  Toe and heel walk were deferred.  Deep tendon reflexes: in the upper and lower extremities are symmetric and intact.  Babinski response was deferred.       After spending a total time of  60 minutes ; including paper records form outside, face to face and additional time for physical and neurologic examination, review of laboratory studies,  personal review of imaging studies, reports and results of other testing and review of referral information / records as far as provided in visit, I have established the following assessments:  1) Daily headaches- weekends and work days, any time, cut down coffee - no changes. Pressure behind the eye, stabbing pain behind the eye. Sharp, nausea. Dizziness, Vertigo. Laughing and coughing 9 valsalva) makes HA worse.  Cluster HA, sharp stabbing in the right eye -waking her up out of sleep , she feels hot and becomes diaphoretic.  2) super obesity, possible pseudotumor cerebri, cluster, vertigo. Marland Kitchen.  3) Snoring, witnessed apneas, and negative HST- reports isolated sleep paralysis, and restricted sleep time. EDS.  4) insomnia.  Melatonin- not successful, she may respond to trazodone. Hot bath before sleep time.  5) hydroxyzine prescribed for sleep by psychiatrist    My Plan is to proceed with:  1) attended sleep study, SPLIT at AHI 10. Bed 2.  2) brain MRI and lumbar puncture to follow-  3) continue with psychiatry/ psychology.   I would like to thank Arnette FeltsMoore, Janece, FNP and Arnette FeltsMoore, Janece, Fnp [address] [place] [id number] for allowing me to meet with and to take care of this pleasant patient.   In short, Glenda Kim is presenting with cluster headaches, insomnia, nausea and vertigo- I the setting of super obesity.  I plan to follow up either personally or through our NP within 3 month.   CC: I will share my notes with PCP.  Electronically signed by: Melvyn Novasarmen Lutisha Knoche, MD 06/30/2021 11:44 AM  [facility] and [facility] certified by The [employer] of Sleep Medicine and Diplomate of the [employer] of [employer] Medicine. Board certified In Neurology through the ABPN, Fellow of the [employer] of Neu[employer]. Medical Director of [facility].

## 2021-06-30 NOTE — Addendum Note (Signed)
Addended by: Melvyn Novas on: 06/30/2021 12:35 PM   Modules accepted: Orders

## 2021-07-12 ENCOUNTER — Telehealth: Payer: Self-pay | Admitting: Neurology

## 2021-07-12 NOTE — Telephone Encounter (Signed)
MCD healthy blue Glenda Kim: [id number] (exp. 07/12/21 to 09/09/21) patient is scheduled at GI for 07/29/21.

## 2021-07-29 ENCOUNTER — Ambulatory Visit
Admission: RE | Admit: 2021-07-29 | Discharge: 2021-07-29 | Disposition: A | Discharge status: Home / Self Care | Payer: Medicaid Other | Source: Ambulatory Visit | Attending: Neurology | Admitting: Neurology

## 2021-07-29 ENCOUNTER — Other Ambulatory Visit: Payer: Self-pay

## 2021-07-29 DIAGNOSIS — Z6841 Body Mass Index (BMI) 40.0 and over, adult: Secondary | ICD-10-CM

## 2021-07-29 DIAGNOSIS — G4719 Other hypersomnia: Secondary | ICD-10-CM

## 2021-07-29 DIAGNOSIS — E662 Morbid (severe) obesity with alveolar hypoventilation: Secondary | ICD-10-CM

## 2021-07-29 DIAGNOSIS — G44001 Cluster headache syndrome, unspecified, intractable: Secondary | ICD-10-CM

## 2021-07-29 DIAGNOSIS — H8101 Meniere's disease, right ear: Secondary | ICD-10-CM

## 2021-07-29 DIAGNOSIS — G4753 Recurrent isolated sleep paralysis: Secondary | ICD-10-CM

## 2021-07-29 DIAGNOSIS — G43809 Other migraine, not intractable, without status migrainosus: Secondary | ICD-10-CM

## 2021-07-29 DIAGNOSIS — G4485 Primary stabbing headache: Secondary | ICD-10-CM

## 2021-07-29 DIAGNOSIS — G932 Benign intracranial hypertension: Secondary | ICD-10-CM

## 2021-07-29 DIAGNOSIS — G4700 Insomnia, unspecified: Secondary | ICD-10-CM

## 2021-07-29 MED ORDER — GADOBENATE DIMEGLUMINE 529 MG/ML IV SOLN
20.0000 mL | Freq: Once | INTRAVENOUS | Status: AC | PRN
  Administered 2021-07-29: 20 mL via INTRAVENOUS

## 2021-08-02 ENCOUNTER — Telehealth: Payer: Self-pay | Admitting: Neurology

## 2021-08-02 ENCOUNTER — Encounter: Payer: Self-pay | Admitting: Neurology

## 2021-08-02 NOTE — Telephone Encounter (Signed)
MRI of the brain was normal, no strokes or masses was or other very concerning etiology.  Incidental mild mucosal sinus thickening.  However there was a finding called a "partially empty sella", this can be a normal variant and we see it all the time, It can be incidental. However given patient's headaches, this can be a sign of something called IDIOPATHIC INTRACRANIAL HYPERTENSION which means there is extra cerebrospinal fluid in the brain. It is condition we see(not uncommonly) in younger women with obesity. The headaches can be improved very effectively by weight loss and by a using medication(diamox) to lessen the normal fluid surrounding the brain. The diagnosis is made by checking the pressure around the brain with a lumbar puncture( spinal tap) and sending patient to an ophthalmologist to look at the back of the eyes. If there is too much pressure, it can cause vision changes or even vision loss. Please discuss with patient and send her information about IDIOPATHIC INTRACRANIAL HYPERTENSION  through mychart if she would like to read more about it. If she agrees I can order the spinal tap and ophthalmologist referral(does she already have an ophthalmologist?). If she requests more information, we can set her up with a follow up with Dr. Vickey Huger or an NP to further explain. Thanks   Brain:   Cerebral volume is normal.   Partially empty sella turcica.   No cortical encephalomalacia is identified. No significant cerebral white matter disease.   There is no acute infarct.   No evidence of an intracranial mass.   No chronic intracranial blood products.   No extra-axial fluid collection.   No midline shift.   No pathologic intracranial enhancement identified.   Vascular: Maintained flow voids within the proximal large arterial vessels. Dominant left vertebral artery.   Skull and upper cervical spine: No focal suspicious marrow lesion is identified.   Sinuses/Orbits: Visualized orbits  show no acute finding. Mild mucosal thickening within the bilateral ethmoid and sphenoid sinuses.   IMPRESSION: Intermittently motion degraded examination.   Partially empty sella turcica. This finding can reflect incidental anatomic variation or can be associated with idiopathic intracranial hypertension (pseudotumor cerebri).   Otherwise unremarkable MRI appearance of the brain.   Mild bilateral ethmoid and sphenoid sinus mucosal thickening.

## 2021-08-02 NOTE — Telephone Encounter (Signed)
Called the pt and reviewed in detail the MRI findings. Advised that Dr Lucia Gaskins would recommend if she is continuing to have the headaches we can complete a lumbar puncture that would release pressure on the brain and determine if we need to add an additional medication. I reviewed what the LP procedure consists of and the pt is agreeable to moving forward with this. I will question the ophthalmologist referral with the patient. Advised the pt that someone from [employer] will be in touch to get her scheduled for this procedure. Pt verbalized understanding.

## 2021-08-03 ENCOUNTER — Telehealth: Payer: Self-pay | Admitting: Neurology

## 2021-08-03 ENCOUNTER — Other Ambulatory Visit: Payer: Self-pay | Admitting: Neurology

## 2021-08-03 DIAGNOSIS — G4719 Other hypersomnia: Secondary | ICD-10-CM

## 2021-08-03 DIAGNOSIS — G932 Benign intracranial hypertension: Secondary | ICD-10-CM

## 2021-08-03 NOTE — Telephone Encounter (Signed)
Sent to Dr. Groat ph # [phone] 

## 2021-08-08 NOTE — Telephone Encounter (Signed)
Sent to [facility] to be scheduled.

## 2021-08-22 ENCOUNTER — Other Ambulatory Visit: Payer: Self-pay | Admitting: Neurology

## 2021-08-22 ENCOUNTER — Telehealth: Payer: Self-pay

## 2021-08-22 DIAGNOSIS — G478 Other sleep disorders: Secondary | ICD-10-CM

## 2021-08-22 DIAGNOSIS — G4719 Other hypersomnia: Secondary | ICD-10-CM

## 2021-08-22 NOTE — Telephone Encounter (Signed)
Order placed for HST for the pt. 

## 2021-08-22 NOTE — Addendum Note (Signed)
Addended by: Darleen Crocker on: 08/22/2021 03:28 PM   Modules accepted: Orders

## 2021-08-22 NOTE — Telephone Encounter (Signed)
Insurance has denied In lab sleep study request. Pt does not meet qualifications for in lab study. However, Insurance is asking if MD is okay with HST? I need an order for HST please. Thanks

## 2021-08-24 ENCOUNTER — Inpatient Hospital Stay (HOSPITAL_COMMUNITY): Admission: RE | Admit: 2021-08-24 | Payer: Medicaid Other | Source: Ambulatory Visit

## 2021-08-26 ENCOUNTER — Ambulatory Visit (HOSPITAL_COMMUNITY): Payer: Medicaid Other

## 2021-09-06 ENCOUNTER — Ambulatory Visit (HOSPITAL_COMMUNITY)
Admission: RE | Admit: 2021-09-06 | Discharge: 2021-09-06 | Disposition: A | Discharge status: Home / Self Care | Payer: Medicaid Other | Source: Ambulatory Visit | Attending: Neurology | Admitting: Neurology

## 2021-09-06 ENCOUNTER — Telehealth: Payer: Self-pay | Admitting: Neurology

## 2021-09-06 ENCOUNTER — Other Ambulatory Visit: Payer: Self-pay

## 2021-09-06 DIAGNOSIS — H8101 Meniere's disease, right ear: Secondary | ICD-10-CM | POA: Diagnosis present

## 2021-09-06 DIAGNOSIS — E662 Morbid (severe) obesity with alveolar hypoventilation: Secondary | ICD-10-CM | POA: Diagnosis present

## 2021-09-06 DIAGNOSIS — G43809 Other migraine, not intractable, without status migrainosus: Secondary | ICD-10-CM

## 2021-09-06 DIAGNOSIS — G4485 Primary stabbing headache: Secondary | ICD-10-CM | POA: Diagnosis present

## 2021-09-06 DIAGNOSIS — Z6841 Body Mass Index (BMI) 40.0 and over, adult: Secondary | ICD-10-CM | POA: Diagnosis present

## 2021-09-06 DIAGNOSIS — G932 Benign intracranial hypertension: Secondary | ICD-10-CM

## 2021-09-06 DIAGNOSIS — G44001 Cluster headache syndrome, unspecified, intractable: Secondary | ICD-10-CM

## 2021-09-06 LAB — CSF CELL COUNT WITH DIFFERENTIAL
RBC Count, CSF: 119 /mm3 — ABNORMAL HIGH
Tube #: 1
WBC, CSF: 7 /mm3 — ABNORMAL HIGH (ref 0–5)

## 2021-09-06 LAB — GLUCOSE, CSF: Glucose, CSF: 59 mg/dL (ref 40–70)

## 2021-09-06 LAB — PROTEIN, CSF: Total  Protein, CSF: 14 mg/dL — ABNORMAL LOW (ref 15–45)

## 2021-09-06 MED ORDER — LIDOCAINE HCL (PF) 1 % IJ SOLN
5.0000 mL | Freq: Once | INTRAMUSCULAR | Status: AC
Start: 2021-09-06 — End: 2021-09-06
  Administered 2021-09-06: 3 mL via SUBCUTANEOUS

## 2021-09-06 NOTE — Progress Notes (Signed)
Very elevated opening pressure in a patient with large BMI.  Please find out if the patients symptoms have improved since removal of CSF.

## 2021-09-06 NOTE — Telephone Encounter (Signed)
-----   Message from Melvyn Novas, MD sent at 09/06/2021  1:04 PM EDT ----- ?Very elevated opening pressure in a patient with large BMI.  Please find out if the patients symptoms have improved since removal of CSF.  ?

## 2021-09-06 NOTE — Procedures (Addendum)
Technically successful fluoro guided LP at L3/L4 level with opening pressure of 41 cm H2O. No closing pressure was obtained.  ?13 cc of clear CSF sent to lab for analysis. ? ?No immediate post procedural complication. ? ?Please see imaging section of Epic for full dictation. ? ? ? ?Alex Gardener, AGNP-BC ?09/06/2021, 10:41 AM ? ? ?

## 2021-09-06 NOTE — Telephone Encounter (Signed)
Called the pt to review the results with her. Advised that there was elevated opening pressure when they completed the LP.  ?Pt states that she is still in the hospital recovering post procedure. At this time feels like there is relief. Informed her that one she is up and going to reply to me on mychart and let me know that she is still doing better after relieving the pressure. Pt verbalized understanding. ?Advised I would discuss with Dr Vickey Huger about whether she is wanting to start a medication for the pt to prevent this. Pt verbalized understanding. ? ?

## 2021-09-07 ENCOUNTER — Encounter: Payer: Self-pay | Admitting: Neurology

## 2021-09-07 ENCOUNTER — Telehealth: Payer: Self-pay | Admitting: Neurology

## 2021-09-07 MED ORDER — ACETAZOLAMIDE 250 MG PO TABS: 250.0000 mg | ORAL_TABLET | Freq: Two times a day (BID) | ORAL | 5 refills | Status: DC

## 2021-09-07 NOTE — Telephone Encounter (Signed)
I have forwarded this information to the patient on my chart ?

## 2021-09-07 NOTE — Telephone Encounter (Signed)
Ordered diamox 250 mg bid to start with, goal is to increase if tolerated to 500 mg bid over the next 2 months.  ?

## 2021-09-08 NOTE — Progress Notes (Signed)
Normal CSF chemistry and cells- there are always some red blood cells when a blood vessel was scratched. That's expected.

## 2021-09-09 LAB — CSF CULTURE W GRAM STAIN: Culture: NO GROWTH

## 2021-09-09 LAB — OLIGOCLONAL BANDS, CSF + SERM

## 2021-09-11 NOTE — Progress Notes (Signed)
Very good - no oligoclonal bands were found in your CSF. No demyelinating illness or infectious disease is present.  ?

## 2021-09-15 ENCOUNTER — Other Ambulatory Visit: Payer: Self-pay | Admitting: Neurology

## 2021-09-15 MED ORDER — ACETAZOLAMIDE 250 MG PO TABS: 250.0000 mg | ORAL_TABLET | Freq: Two times a day (BID) | ORAL | 5 refills | Status: DC

## 2021-10-26 ENCOUNTER — Ambulatory Visit: Payer: Medicaid Other | Admitting: Neurology

## 2021-10-26 DIAGNOSIS — G478 Other sleep disorders: Secondary | ICD-10-CM

## 2021-10-26 DIAGNOSIS — G4710 Hypersomnia, unspecified: Secondary | ICD-10-CM | POA: Diagnosis not present

## 2021-10-26 DIAGNOSIS — E662 Morbid (severe) obesity with alveolar hypoventilation: Secondary | ICD-10-CM

## 2021-10-26 DIAGNOSIS — G4719 Other hypersomnia: Secondary | ICD-10-CM

## 2021-10-31 NOTE — Progress Notes (Signed)
? ? ? ?  ?  ?[facility] ?  ?HOME SLEEP TEST REPORT ( by [facility] PAT)   ?STUDY DATE:  5-3 to 10-31-2021  ?ORDERING CLINICIAN: Melvyn Novas, MD  ?REFERRING CLINICIAN: Arnette Felts, NP  ?  ?CLINICAL INFORMATION/HISTORY: Glenda Kim is a 21 y.o. American female patient and seen on 06/30/2021 from Arnette Felts, FNP,  for a Sleep consultation.  ?Chief concern according to patient :   ?Mrs. Caraveo moved here from [place] where she was followed by [facility], she relocated to [place] last Summer and lives with her female partner. She has been working as a Electrical engineer. ?She has a past medical history of gastroparesis, frequent vomiting, latent nausea and endometriosis with PCOS, irregular menstrual cycle even with birth control pills.She achieved regular menstrual cycles for a while but now they are irregular again. She is taking hydroxyzine 25 mg as needed rizatriptan for frequent migrainous headaches and she can take up to 2 a day of 10 mg. She is at risk for intracranial hypertension/ pseudotumour by weight alone.  ?She is currently not on sucralfate -Reglan was given for a while for gastroparesis and is no longer taken, she is no longer taking Metformin, which had been initiated to control PCOS effect. ? ?Her family practice ordered a sleep study : Interpreting physician was Dr. Delynn Flavin school, MD ,study date was Feb 2nd, 2022 , she was tested by home sleep test and the test showed: 16 apneas 7 mixed, 6 obstructive, 3 centrals and 22 hypopneas.  The AHI for hour of sleep was presumed to be 3.6/h.  Oxygen saturation remained stable longest time in oxygen desaturation was only for a total time of 1.7 minutes.  Mean heart rate 77 bpm, so they did not find a significant amount of apnea.  Looking for the HST device that was used -not specified here . ?The patient states that usually she gets just 4 to 6 hours of sleep and that she doubts the validity of the study for that reason.   ?We will definitely  have to evaluate her with a different equipment. ?  ? ? ?  ?Epworth sleepiness score: 12/24. ?  ?BMI: 68.5 kg/m? ?  ?Neck Circumference: 16" ?  ?FINDINGS: ?  ?Sleep Summary: ?  ?Total Recording Time (hours, min): Total recording time amounted to 7 hours and 54 minutes of which the total sleep time was only 5 hours 34 minutes.  REM time was 23.5% of total sleep time.     ?  ?Respiratory Indices: ?  ?Calculated pAHI (per hour): Overall apnea hypopnea index was 13.1/h, during REM sleep this significantly increased to 35.4/h.  In non-REM sleep the AHI was only 5.8/h.                         ?Positional AHI:   The patient slept for most time in the prone position 160 minutes with an AHI of 16.9/h.  This was followed by sleep in the supine position with an AHI of 16.4/h and then by left sided sleep with an AHI of only 3/h. ? ?Snoring statistics show a mean volume of snoring at 42 dB , snoring accompanied roughly for sleep 40% of total sleep time and reached up to 60 dB at times.                                             ?  ?  Oxygen Saturation Statistics: ?O2 Saturation Range (%):    Between a nadir nadir of 89% and a maximum of 100% with a mean saturation of 96% oxygen                                 ?  ?O2 Saturation (minutes) <89%: 0 0 minutes ?Please note that CO2 could not be measured with this home sleep test.       ?  ?Pulse Rate Statistics: ?  ?Pulse Mean (bpm):   66 bpm            ?  ?Pulse Range:    Between 49 and 104 bpm.  These HST can give heart rate but not cardiac rhythm information.           ?  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  This was not associated with hypoxemia.  There is also a positional component as neither supine nor prone sleep were associated with a reduction in AHI only left-sided sleep seem to have been beneficial in reducing the AHI under 5. ?  ?RECOMMENDATION: I would strongly recommend positive airway  pressure therapy for REM dependent apnea.  This young patient is at high risk of obesity hypoventilation and a home sleep test can unfortunately not if she state if she retains CO2. ?The majority of respiratory events was associated with a drop in oxygen by 3% not 4% and would indicate hypoventilation or hypopnea driven events. ?Will be beneficial for the patient to sleep on her side as long as other treatment has not been initiated. ?  ?INTERPRETING PHYSICIAN: ? ? Melvyn Novas, MD  ? ?Medical Director of [facility].  ? ? ? ? ? ? ? ? ? ? ? ? ? ? ? ? ? ? ? ?

## 2021-11-03 ENCOUNTER — Encounter: Payer: Self-pay | Admitting: Neurology

## 2021-11-03 DIAGNOSIS — G478 Other sleep disorders: Secondary | ICD-10-CM | POA: Insufficient documentation

## 2021-11-03 DIAGNOSIS — G4733 Obstructive sleep apnea (adult) (pediatric): Secondary | ICD-10-CM

## 2021-11-03 NOTE — Addendum Note (Signed)
Addended by: Melvyn Novas on: 11/03/2021 12:28 PM ? ? Modules accepted: Orders ? ?

## 2021-11-03 NOTE — Procedures (Signed)
[facility] ?  ?HOME SLEEP TEST REPORT ( by Watch PAT)   ?STUDY DATE:  5-3 to 10-31-2021  ?ORDERING CLINICIAN: Melvyn Novas, MD  ?REFERRING CLINICIAN: Arnette Felts, NP  ?  ?CLINICAL INFORMATION/HISTORY: Glenda Kim is a 21 y.o. American female patient and seen on 06/30/2021 from Arnette Felts, FNP,  for a Sleep consultation.  ?Chief concern according to patient :   ?Glenda Kim moved here from [place] where she was followed by [facility], she relocated to [place] last Summer and lives with her female partner. She has been working as a Electrical engineer. ?She has a past medical history of gastroparesis, frequent vomiting, latent nausea and endometriosis with PCOS, irregular menstrual cycle even with birth control pills.She achieved regular menstrual cycles for a while but now they are irregular again. She is taking hydroxyzine 25 mg as needed rizatriptan for frequent migrainous headaches and she can take up to 2 a day of 10 mg. She is at risk for intracranial hypertension/ pseudotumour by weight alone.  ?She is currently not on sucralfate -Reglan was given for a while for gastroparesis and is no longer taken, she is no longer taking Metformin, which had been initiated to control PCOS effect. ? ?Her family practice ordered a sleep study : Interpreting physician was Dr. Delynn Kim school, MD ,study date was Feb 2nd, 2022 , she was tested by home sleep test and the test showed: 16 apneas 7 mixed, 6 obstructive, 3 centrals and 22 hypopneas.  The AHI for hour of sleep was presumed to be 3.6/h.  Oxygen saturation remained stable longest time in oxygen desaturation was only for a total time of 1.7 minutes.  Mean heart rate 77 bpm, so they did not find a significant amount of apnea.  Looking for the HST device that was used -not specified here . ?The patient states that usually she gets just 4 to 6 hours of sleep and that she doubts the validity of the study for that reason.   ?We will definitely have to  evaluate her with a different equipment. ?  ?  ?  ?  ?Epworth sleepiness score: 12/24. ?  ?BMI: 68.5 kg/m? ?  ?Neck Circumference: 16" ?  ?FINDINGS: ?  ?Sleep Summary: ?  ?Total Recording Time (hours, min): Total recording time amounted to 7 hours and 54 minutes of which the total sleep time was only 5 hours 34 minutes.  REM time was 23.5% of total sleep time.     ?  ?Respiratory Indices: ?  ?Calculated pAHI (per hour): Overall apnea hypopnea index was 13.1/h, during REM sleep this significantly increased to 35.4/h.  In non-REM sleep the AHI was only 5.8/h.                         ?Positional AHI:   The patient slept for most time in the prone position 160 minutes with an AHI of 16.9/h.  This was followed by sleep in the supine position with an AHI of 16.4/h and then by left sided sleep with an AHI of only 3/h. ? ?Snoring statistics show a mean volume of snoring at 42 dB , snoring accompanied roughly for sleep 40% of total sleep time and reached up to 60 dB at times.                                             ?  ?  Oxygen Saturation Statistics: ?O2 Saturation Range (%):    Between a nadir nadir of 89% and a maximum of 100% with a mean saturation of 96% oxygen                                 ?  ?O2 Saturation (minutes) <89%: 0 0 minutes ?Please note that CO2 could not be measured with this home sleep test.       ?  ?Pulse Rate Statistics: ?  ?Pulse Mean (bpm):   66 bpm            ?  ?Pulse Range:    Between 49 and 104 bpm.  These HST can give heart rate but not cardiac rhythm information.           ?  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  This was not associated with hypoxemia.  There is also a positional component as neither supine nor prone sleep were associated with a reduction in AHI only left-sided sleep seem to have been beneficial in reducing the AHI under 5. ?  ?RECOMMENDATION: I would strongly recommend positive airway pressure  therapy for REM dependent apnea.  This young patient is at high risk of obesity hypoventilation and a home sleep test can unfortunately not if she state if she retains CO2. ?The majority of respiratory events was associated with a drop in oxygen by 3% not 4% and would indicate hypoventilation or hypopnea driven events. ?Will be beneficial for the patient to sleep on her side as long as other treatment has not been initiated. ? ?I will invite this patient to have an in laboratory titration to PAP therapy, this way a mask can be chosen carefully and she can change between treatment modalities as needed.  ? ?Plan B would be CPAP autotitration between 6-16 cm water, 3 cm EPR and interface that allows prone and side sleep with heated humidification.  ?  ?INTERPRETING PHYSICIAN: ?  ? Melvyn Novas, MD  ?  ?Medical Director of [facility].  ?  ?  ?  ?  ?  ?  ?  ?  ?  ?  ?  ?  ?  ?  ?   ?  ? ?

## 2021-11-09 NOTE — Telephone Encounter (Signed)
Called and spoke with pt about results per Dr. Edwena Felty note. Pt agreeable with plan. She now has additional insurnace with her job in addition to [employer] on file. She will upload front/back to mychart and send message once uploaded so we can forward to sleep lab. She is aware sleep lab will call to schedule cpap titration once approved via insurance.  ?

## 2021-11-17 NOTE — Telephone Encounter (Signed)
Called pt to let her know insurance will not cover cpap titration. Must try autopap first. Pt agreeable to this. I reviewed PAP compliance expectations with the pt. Pt is agreeable to starting a CPAP. I advised pt that an order will be sent to a DME, [facility], and [facility] will call the pt within about one week after they file with the pt's insurance. [facility] will show the pt how to use the machine, fit for masks, and troubleshoot the CPAP if needed. A follow up appt was made for insurance purposes with Dr. Vickey Huger on 02/01/2022 at 1:30pm. Pt verbalized understanding to arrive 15 minutes early and bring their CPAP. A mychart mesage with all of this information sent to pt as a reminder (she requested mychart over letter). Pt had no questions at this time but was encouraged to call back if questions arise. I have sent the order to [facility] and have received confirmation that they have received the order.

## 2021-11-17 NOTE — Telephone Encounter (Signed)
Insurance will allow for autopap please go ahead and get patient set up. Insurance will allow a CPAP titration at a later date if the patient has any issues.

## 2021-11-17 NOTE — Addendum Note (Signed)
Addended by: Wyvonnia Lora on: 11/17/2021 03:37 PM   Modules accepted: Orders

## 2022-01-15 ENCOUNTER — Emergency Department (HOSPITAL_COMMUNITY): Payer: No Typology Code available for payment source

## 2022-01-15 ENCOUNTER — Encounter (HOSPITAL_COMMUNITY): Payer: Self-pay

## 2022-01-15 ENCOUNTER — Emergency Department (HOSPITAL_COMMUNITY)
Admission: EM | Admit: 2022-01-15 | Discharge: 2022-01-15 | Disposition: A | Discharge status: Home / Self Care | Payer: No Typology Code available for payment source | Attending: Emergency Medicine | Admitting: Emergency Medicine

## 2022-01-15 ENCOUNTER — Other Ambulatory Visit: Payer: Self-pay

## 2022-01-15 DIAGNOSIS — B2790 Infectious mononucleosis, unspecified without complication: Secondary | ICD-10-CM | POA: Diagnosis not present

## 2022-01-15 DIAGNOSIS — J029 Acute pharyngitis, unspecified: Secondary | ICD-10-CM | POA: Diagnosis present

## 2022-01-15 LAB — CBC WITH DIFFERENTIAL/PLATELET
Abs Immature Granulocytes: 0.02 10*3/uL (ref 0.00–0.07)
Basophils Absolute: 0.1 10*3/uL (ref 0.0–0.1)
Basophils Relative: 1 %
Eosinophils Absolute: 0.1 10*3/uL (ref 0.0–0.5)
Eosinophils Relative: 1 %
HCT: 42.9 % (ref 36.0–46.0)
Hemoglobin: 13.7 g/dL (ref 12.0–15.0)
Immature Granulocytes: 0 %
Lymphocytes Relative: 42 %
Lymphs Abs: 3.4 10*3/uL (ref 0.7–4.0)
MCH: 30.4 pg (ref 26.0–34.0)
MCHC: 31.9 g/dL (ref 30.0–36.0)
MCV: 95.1 fL (ref 80.0–100.0)
Monocytes Absolute: 0.9 10*3/uL (ref 0.1–1.0)
Monocytes Relative: 12 %
Neutro Abs: 3.5 10*3/uL (ref 1.7–7.7)
Neutrophils Relative %: 44 %
Platelets: 247 10*3/uL (ref 150–400)
RBC: 4.51 MIL/uL (ref 3.87–5.11)
RDW: 13.8 % (ref 11.5–15.5)
WBC: 8 10*3/uL (ref 4.0–10.5)
nRBC: 0 % (ref 0.0–0.2)

## 2022-01-15 LAB — BASIC METABOLIC PANEL
Anion gap: 7 (ref 5–15)
BUN: 11 mg/dL (ref 6–20)
CO2: 25 mmol/L (ref 22–32)
Calcium: 8.9 mg/dL (ref 8.9–10.3)
Chloride: 108 mmol/L (ref 98–111)
Creatinine, Ser: 0.88 mg/dL (ref 0.44–1.00)
GFR, Estimated: >60 mL/min (ref >60)
Glucose, Bld: 91 mg/dL (ref 70–99)
Potassium: 3.7 mmol/L (ref 3.5–5.1)
Sodium: 140 mmol/L (ref 135–145)

## 2022-01-15 LAB — GROUP A STREP BY PCR: Group A Strep by PCR: NOT DETECTED

## 2022-01-15 LAB — MONONUCLEOSIS SCREEN: Mono Screen: POSITIVE — AB

## 2022-01-15 MED ORDER — MORPHINE SULFATE (PF) 4 MG/ML IV SOLN
4.0000 mg | Freq: Once | INTRAVENOUS | Status: AC
  Administered 2022-01-15: 4 mg via INTRAVENOUS
  Filled 2022-01-15: qty 1

## 2022-01-15 MED ORDER — SODIUM CHLORIDE (PF) 0.9 % IJ SOLN
INTRAMUSCULAR | Status: AC
  Filled 2022-01-15: qty 50

## 2022-01-15 MED ORDER — IOHEXOL 300 MG/ML  SOLN
75.0000 mL | Freq: Once | INTRAMUSCULAR | Status: AC | PRN
  Administered 2022-01-15: 75 mL via INTRAVENOUS

## 2022-01-15 MED ORDER — DEXAMETHASONE SODIUM PHOSPHATE 10 MG/ML IJ SOLN
6.0000 mg | Freq: Once | INTRAMUSCULAR | Status: AC
  Administered 2022-01-15: 6 mg via INTRAVENOUS
  Filled 2022-01-15: qty 1

## 2022-01-15 MED ORDER — SODIUM CHLORIDE 0.9 % IV BOLUS
1000.0000 mL | Freq: Once | INTRAVENOUS | Status: AC
  Administered 2022-01-15: 1000 mL via INTRAVENOUS

## 2022-01-15 MED ORDER — ONDANSETRON HCL 4 MG/2ML IJ SOLN
4.0000 mg | Freq: Once | INTRAMUSCULAR | Status: AC
  Administered 2022-01-15: 4 mg via INTRAVENOUS
  Filled 2022-01-15: qty 2

## 2022-01-15 NOTE — Discharge Instructions (Addendum)
Call your primary care doctor or specialist as discussed in the next 2-3 days.   Return immediately back to the ER if:  Your symptoms worsen within the next 12-24 hours. You develop new symptoms such as new fevers, persistent vomiting, new pain, shortness of breath, or new weakness or numbness, or if you have any other concerns.  

## 2022-01-15 NOTE — ED Provider Notes (Signed)
[facility]-EMERGENCY DEPT Provider Note   CSN: [id number] Arrival date & time: 01/15/22  1229     History  Chief Complaint  Patient presents with   Sore Throat    Glenda Kim is a 21 y.o. female.  Patient presents to ER chief complaint of 1 week of ear pain and throat pain ongoing for the past week associated with body aches fevers and generalized fatigue.  She states it hurts too much to swallow foods although she is able to maintain her secretions.  Denies any vomiting or diarrhea.  She went to urgent care initially was prescribed Augmentin.  Symptoms did not improve she went to urgent care again and was prescribed cefdinir.  She continues his antibiotics without improvement and presents to ER today stating symptoms are not improving.       Home Medications Prior to Admission medications   Medication Sig Start Date End Date Taking? Authorizing Provider  acetaZOLAMIDE (DIAMOX) 250 MG tablet Take 1 tablet (250 mg total) by mouth 2 (two) times daily. 09/15/21   Dohmeier, Porfirio Mylar, MD  hydrOXYzine (ATARAX) 25 MG tablet Take 0.5-1 tablets by mouth as needed. 06/22/21   [provider]  metoCLOPramide (REGLAN) 10 MG tablet Take by mouth. 05/14/20   [provider]  rizatriptan (MAXALT) 10 MG tablet Take 1 tablet by mouth daily. as needed for Migraine. May repeat in 2 hours if needed 06/22/21   [provider]  sucralfate (CARAFATE) 1 GM/10ML suspension Take 1 mL by mouth 4 (four) times daily -  before meals and at bedtime. Patient not taking: Reported on 06/30/2021 01/30/18   [provider]  traZODone (DESYREL) 50 MG tablet Take 1 tablet (50 mg total) by mouth at bedtime as needed for sleep. 06/30/21   Dohmeier, Porfirio Mylar, MD      Allergies    Shellfish allergy    Review of Systems   Review of Systems  Constitutional:  Positive for fever.  HENT:  Positive for sore throat. Negative for ear pain.   Eyes:  Negative for pain.   Respiratory:  Negative for cough.   Cardiovascular:  Negative for chest pain.  Gastrointestinal:  Negative for abdominal pain.  Genitourinary:  Negative for flank pain.  Musculoskeletal:  Negative for back pain.  Skin:  Negative for rash.  Neurological:  Negative for headaches.    Physical Exam Updated Vital Signs BP 122/76   Pulse (!) 101   Temp 99 F (37.2 C) (Oral)   Resp 18   Ht 5\' 7"  (1.702 m)   Wt (!) 200.5 kg   LMP 01/15/2022   SpO2 97%   BMI 69.23 kg/m  Physical Exam Constitutional:      General: She is not in acute distress.    Appearance: Normal appearance.  HENT:     Head: Normocephalic.     Nose: Nose normal.     Mouth/Throat:     Comments: Exudate noted on the right tonsil.  No uvular deviation.  Tonsils are 2+ swelling.  Bilateral cervical lymphadenopathy present. Eyes:     Extraocular Movements: Extraocular movements intact.  Cardiovascular:     Rate and Rhythm: Normal rate.  Pulmonary:     Effort: Pulmonary effort is normal.  Abdominal:     General: There is no distension.     Palpations: Abdomen is soft.     Tenderness: There is no abdominal tenderness. There is no rebound.  Musculoskeletal:        General: Normal range  of motion.     Cervical back: Normal range of motion.  Neurological:     General: No focal deficit present.     Mental Status: She is alert. Mental status is at baseline.     ED Results / Procedures / Treatments   Labs (all labs ordered are listed, but only abnormal results are displayed) Labs Reviewed  MONONUCLEOSIS SCREEN - Abnormal; Notable for the following components:      Result Value   Mono Screen POSITIVE (*)    All other components within normal limits  GROUP A STREP BY PCR  CBC WITH DIFFERENTIAL/PLATELET  BASIC METABOLIC PANEL    EKG None  Radiology CT Soft Tissue Neck W Contrast  Result Date: 01/15/2022 CLINICAL DATA:  Provided history: Epiglottitis or tonsillitis suspected. Sore throat. EXAM: CT NECK  WITH CONTRAST TECHNIQUE: Multidetector CT imaging of the neck was performed using the standard protocol following the bolus administration of intravenous contrast. RADIATION DOSE REDUCTION: This exam was performed according to the departmental dose-optimization program which includes automated exposure control, adjustment of the mA and/or kV according to patient size and/or use of iterative reconstruction technique. CONTRAST:  17mL OMNIPAQUE IOHEXOL 300 MG/ML  SOLN COMPARISON:  No pertinent prior exams available for comparison. FINDINGS: Pharynx and larynx: Symmetric prominence of the adenoid and palatine tonsils. No appreciable swelling or discrete mass elsewhere within the oral cavity, pharynx or larynx. No evidence of peritonsillar abscess. Mild narrowing of the oropharyngeal airway. Salivary glands: No inflammation, mass, or stone. Thyroid: Unremarkable. Lymph nodes: Fairly extensive bilateral cervical lymphadenopathy. An index right level 2 lymph node measures 18 mm in short axis. Vascular: The major vascular structures of the neck are patent. Limited intracranial: No evidence of acute intracranial mallet within the field of view. Visualized orbits: No orbital mass or acute orbital finding. Mastoids and visualized paranasal sinuses: No significant paranasal sinus disease or mastoid effusion at the imaged levels. Skeleton: No acute fracture or aggressive osseous lesion. Upper chest: No consolidation within the imaged lung apices. IMPRESSION: Symmetric prominence of the adenoid and palatine tonsils. Associated fairly extensive lymphadenopathy within the bilateral neck. This constellation of findings may reflect acute tonsillitis/pharyngitis with reactive lymphadenopathy in the appropriate clinical setting. However, close clinical follow-up is recommended (with imaging follow-up as warranted) to ensure resolution and to exclude alternative etiologies. Mild narrowing of the oropharyngeal airway. No evidence of  peritonsillar abscess. Electronically Signed   By: Jackey Loge D.O.   On: 01/15/2022 15:17    Procedures Procedures    Medications Ordered in ED Medications  sodium chloride (PF) 0.9 % injection (has no administration in time range)  sodium chloride 0.9 % bolus 1,000 mL (1,000 mLs Intravenous New Bag/Given 01/15/22 1346)  morphine (PF) 4 MG/ML injection 4 mg (4 mg Intravenous Given 01/15/22 1349)  ondansetron (ZOFRAN) injection 4 mg (4 mg Intravenous Given 01/15/22 1347)  iohexol (OMNIPAQUE) 300 MG/ML solution 75 mL (75 mLs Intravenous Contrast Given 01/15/22 1437)  dexamethasone (DECADRON) injection 6 mg (6 mg Intravenous Given 01/15/22 1542)    ED Course/ Medical Decision Making/ A&P                           Medical Decision Making Amount and/or Complexity of Data Reviewed Labs: ordered. Radiology: ordered.  Risk Prescription drug management.   Review of record shows an office visit January 16, 2022 for otitis media of the right ear.  Patient continues on antibiotics at home.  Cardiac  monitoring showing sinus rhythm.  Labs are sent.  White count 8 hemoglobin 13 chemistry unremarkable Mono test is positive.  CT scan of the neck shows no abscess.  Patient given Decadron and morphine.  Advise continue medications at home.  Advised immediate return for worsening symptoms or any additional concerns.  Advise follow-up with her primary care doctor within the week.        Final Clinical Impression(s) / ED Diagnoses Final diagnoses:  Infectious mononucleosis without complication, infectious mononucleosis due to unspecified organism    Rx / DC Orders ED Discharge Orders     None         Cheryll Cockayne, MD 01/15/22 1554

## 2022-01-15 NOTE — ED Triage Notes (Signed)
Patient states that she has had a sore throat and otalgia x 1 week and worsening.  Patient reports that she is not able to swallow her saliva. Patient's sats- 97%. Patient reports difficulty talking.

## 2022-01-18 ENCOUNTER — Telehealth: Payer: Self-pay

## 2022-01-18 NOTE — Telephone Encounter (Signed)
Transition Care Management Follow-up Telephone Call Date of discharge and from where: 01/15/2022 [facility]  How have you been since you were released from the hospital? Pt states she is still having soreness / jaw discomfort.  Any questions or concerns? No  Items Reviewed: Did the pt receive and understand the discharge instructions provided? Yes  Medications obtained and verified? Yes  Other? No  Any new allergies since your discharge? No  Dietary orders reviewed? Yes Do you have support at home? Yes   Home Care and Equipment/Supplies: Were home health services ordered? no If so, what is the name of the agency? N/a  Has the agency set up a time to come to the patient's home? no Were any new equipment or medical supplies ordered?  No What is the name of the medical supply agency? N/a Were you able to get the supplies/equipment? no Do you have any questions related to the use of the equipment or supplies? No  Functional Questionnaire: (I = Independent and D = Dependent) ADLs: i  Bathing/Dressing- i  Meal Prep- i  Eating- i  Maintaining continence- i  Transferring/Ambulation- i  Managing Meds- i  Follow up appointments reviewed:  PCP Hospital f/u appt confirmed? Yes  Scheduled to see Arnette Felts  on 01/27/2022  @ [facility] . Specialist Hospital f/u appt confirmed? No  Scheduled to see n/a on n/a @ n/a. Are transportation arrangements needed? No  If their condition worsens, is the pt aware to call PCP or go to the Emergency Dept.? Yes Was the patient provided with contact information for the PCP's office or ED? Yes Was to pt encouraged to call back with questions or concerns? Yes

## 2022-01-19 ENCOUNTER — Ambulatory Visit: Payer: Medicaid Other | Admitting: Physician Assistant

## 2022-01-27 ENCOUNTER — Ambulatory Visit: Payer: No Typology Code available for payment source | Admitting: Nurse Practitioner

## 2022-02-01 ENCOUNTER — Encounter: Payer: Self-pay | Admitting: Neurology

## 2022-02-01 ENCOUNTER — Ambulatory Visit: Payer: Medicaid Other | Admitting: Neurology

## 2022-02-02 ENCOUNTER — Encounter: Payer: Self-pay | Admitting: Nurse Practitioner

## 2022-02-02 ENCOUNTER — Ambulatory Visit (INDEPENDENT_AMBULATORY_CARE_PROVIDER_SITE_OTHER): Payer: No Typology Code available for payment source | Admitting: Nurse Practitioner

## 2022-02-02 VITALS — BP 120/66 | HR 84 | Temp 98.3°F | Ht 67.0 in | Wt >= 6400 oz

## 2022-02-02 DIAGNOSIS — Z23 Encounter for immunization: Secondary | ICD-10-CM | POA: Diagnosis not present

## 2022-02-02 DIAGNOSIS — Z01818 Encounter for other preprocedural examination: Secondary | ICD-10-CM | POA: Diagnosis not present

## 2022-02-02 DIAGNOSIS — J029 Acute pharyngitis, unspecified: Secondary | ICD-10-CM

## 2022-02-02 NOTE — Progress Notes (Unsigned)
Barnet Glasgow Martin,acting as a Education administrator for Minette Brine, FNP.,have documented all relevant documentation on the behalf of Minette Brine, FNP,as directed by  Minette Brine, FNP while in the presence of Minette Brine, [place].    Subjective:     Patient ID: Glenda Kim , female    DOB: Jan 15, 2001 , 21 y.o.   MRN: [id number]   No chief complaint on file.   HPI  Patient presents today to discuss weight loss surgery, patient states she is having weight loss surgery but is not sure when. Patient wants her labs done now. She also complains of trouble with tonsils she states she went to the ER on 01/15/2022 for what they said was strep throat, mono, tonsillitis. Patient states she started antibiotics from urgent care but the ER told her not to take them because they were not helping.  Patient states her bariatrics doctor told her to discontinue a lot of medications.   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 She is to have an appt with nutrition and then will set her date for decision.   She was seen in the ER on 7/23 for sore throat had already been on an antibiotic which was not getting better. When went to er was checked for mono which was positive after stopping the antibiotic her throat improved. No longer has a sore throat but would like to be evaluated by ENT due to frequent problems with her throat.     Past Medical History:  Diagnosis Date   Anxiety    Cyclic vomiting syndrome    Depression    Endometriosis    Gastroparesis    GERD (gastroesophageal reflux disease)      Family History  Problem Relation Age of Onset   Depression Mother    Anxiety disorder Mother    Heart disease Father    Cancer Father    Healthy Sister    Depression Brother    Anxiety disorder Brother    Irritable bowel syndrome Maternal Grandmother     Crohn's disease Maternal Grandmother    Cancer Maternal Grandfather    Hypertension Paternal Grandmother      Current Outpatient Medications:    hydrOXYzine (ATARAX) 25 MG tablet, Take 0.5-1 tablets by mouth as needed., Disp: , Rfl:    acetaZOLAMIDE (DIAMOX) 250 MG tablet, Take 1 tablet (250 mg total) by mouth 2 (two) times daily. (Patient not taking: Reported on 02/02/2022), Disp: 60 tablet, Rfl: 5   metoCLOPramide (REGLAN) 10 MG tablet, Take by mouth. (Patient not taking: Reported on 02/02/2022), Disp: , Rfl:    rizatriptan (MAXALT) 10 MG tablet, Take 1 tablet by mouth daily. as needed for Migraine. May repeat in 2 hours if needed (Patient not taking: Reported on 02/02/2022), Disp: , Rfl:    sucralfate (CARAFATE) 1 GM/10ML suspension, Take 1 mL by mouth 4 (four) times daily -  before meals and at bedtime. (Patient not taking: Reported on 02/02/2022), Disp: , Rfl:    traZODone (DESYREL) 50 MG tablet, Take 1 tablet (50 mg total) by mouth at bedtime as needed for sleep. (Patient not taking: Reported on 02/02/2022), Disp: 30 tablet, Rfl: 1   Vitamin D, Ergocalciferol, (DRISDOL) 1.25 MG (50000 UNIT) CAPS capsule, Take 1 capsule (50,000 Units total) by mouth 2 (two) times a week., Disp: 24 capsule, Rfl: 1  Allergies  Allergen Reactions   Shellfish Allergy Anaphylaxis     Review of Systems  Constitutional: Negative.   HENT: Negative.    Eyes: Negative.   Respiratory: Negative.    Cardiovascular: Negative.   Gastrointestinal: Negative.      Today's Vitals   02/02/22 1620  BP: 120/66  Pulse: 84  Temp: 98.3 F (36.8 C)  TempSrc: Oral  Weight: (!) 440 lb (199.6 kg)  Height: $Remove'5\' 7"'WyNEgcy$  (1.702 m)  PainSc: 0-No pain   Body mass index is 68.91 kg/m.  Wt Readings from Last 3 Encounters:  02/02/22 (!) 440 lb (199.6 kg)  01/15/22 (!) 442 lb (200.5 kg)  06/30/21 (!) 437 lb 8 oz (198.4 kg)    Objective:  Physical Exam Vitals reviewed.  Constitutional:      General: She is not in acute  distress.    Appearance: Normal appearance. She is well-developed.     Comments: Morbid obese  Cardiovascular:     Rate and Rhythm: Normal rate and regular rhythm.     Pulses: Normal pulses.     Heart sounds: Normal heart sounds. No murmur heard. Pulmonary:     Effort: Pulmonary effort is normal. No respiratory distress.     Breath sounds: Normal breath sounds.  Chest:     Chest wall: No tenderness.  Musculoskeletal:        General: Normal range of motion.  Skin:    General: Skin is warm and dry.     Capillary Refill: Capillary refill takes less than 2 seconds.  Neurological:     General: No focal deficit present.     Mental Status: She is alert and oriented to person, place, and time.     Cranial Nerves: No cranial nerve deficit.     Motor: No weakness.  Psychiatric:        Mood and Affect: Mood normal.        Behavior: Behavior normal.        Thought Content: Thought content normal.        Judgment: Judgment normal.         Assessment And Plan:     1. Sore throat Comments: Continues to have a sore throat and feeling like something is stuck, will refer to ENT for further evaluation - Ambulatory referral to ENT  2. Pre-operative clearance Comments: Will refer to cardiology for pre op clearance, had an abnormal EKG previously. EKG today is shows anterolateral ST elevation. No chest pain/shortness of breath - Ambulatory referral to Cardiology - EKG 12-Lead - Lipid panel - TSH - Vitamin A - VITAMIN D 25 Hydroxy (Vit-D Deficiency, Fractures) - Vitamin E - Vitamin K1, Serum - Zinc - Parathyroid Hormone, Intact w/Ca - Hemoglobin A1c - B12 and Folate Panel - Ferritin - Copper, Serum - CMP14+EGFR - CBC no Diff - Vitamin B1  3. Need for vaccination Will give tetanus vaccine today while in office. Refer to order management. TDAP will be administered to adults 6-9 years old every 10 years. - Tdap vaccine greater than or equal to 7yo IM     Patient was given  opportunity to ask questions. Patient verbalized understanding of the plan and was able to repeat key elements of the plan. All questions were answered to their satisfaction.  Minette Brine, FNP   I, Minette Brine, FNP, have reviewed all documentation for this visit. The documentation on 02/02/22 for the exam, diagnosis, procedures, and orders are all accurate and complete.   IF YOU HAVE  BEEN REFERRED TO A SPECIALIST, IT MAY TAKE 1-2 WEEKS TO SCHEDULE/PROCESS THE REFERRAL. IF YOU HAVE NOT HEARD FROM US/SPECIALIST IN TWO WEEKS, PLEASE GIVE [place] A CALL AT [phone].   THE PATIENT IS ENCOURAGED TO PRACTICE SOCIAL DISTANCING DUE TO THE COVID-19 PANDEMIC.

## 2022-02-07 ENCOUNTER — Encounter: Payer: Self-pay | Admitting: Neurology

## 2022-02-07 LAB — VITAMIN E
Vitamin E (Alpha Tocopherol): 7.8 mg/L (ref 5.9–19.4)
Vitamin E(Gamma Tocopherol): 2.6 mg/L (ref 0.7–4.9)

## 2022-02-07 LAB — CMP14+EGFR
ALT: 28 IU/L (ref 0–32)
AST: 19 IU/L (ref 0–40)
Albumin/Globulin Ratio: 1.5 (ref 1.2–2.2)
Albumin: 4.3 g/dL (ref 4.0–5.0)
Alkaline Phosphatase: 88 IU/L (ref 44–121)
BUN/Creatinine Ratio: 18 (ref 9–23)
BUN: 14 mg/dL (ref 6–20)
Bilirubin Total: 0.3 mg/dL (ref 0.0–1.2)
CO2: 22 mmol/L (ref 20–29)
Calcium: 9.4 mg/dL (ref 8.7–10.2)
Chloride: 101 mmol/L (ref 96–106)
Creatinine, Ser: 0.8 mg/dL (ref 0.57–1.00)
Globulin, Total: 2.8 g/dL (ref 1.5–4.5)
Glucose: 85 mg/dL (ref 70–99)
Potassium: 4.5 mmol/L (ref 3.5–5.2)
Sodium: 134 mmol/L (ref 134–144)
Total Protein: 7.1 g/dL (ref 6.0–8.5)
eGFR: 106 mL/min/{1.73_m2} (ref >59)

## 2022-02-07 LAB — LIPID PANEL
Chol/HDL Ratio: 3.4 ratio (ref 0.0–4.4)
Cholesterol, Total: 160 mg/dL (ref 100–199)
HDL: 47 mg/dL (ref >39)
LDL Chol Calc (NIH): 90 mg/dL (ref 0–99)
Triglycerides: 129 mg/dL (ref 0–149)
VLDL Cholesterol Cal: 23 mg/dL (ref 5–40)

## 2022-02-07 LAB — CBC
Hematocrit: 39.9 % (ref 34.0–46.6)
Hemoglobin: 13.1 g/dL (ref 11.1–15.9)
MCH: 29.9 pg (ref 26.6–33.0)
MCHC: 32.8 g/dL (ref 31.5–35.7)
MCV: 91 fL (ref 79–97)
Platelets: 294 10*3/uL (ref 150–450)
RBC: 4.38 x10E6/uL (ref 3.77–5.28)
RDW: 13.1 % (ref 11.7–15.4)
WBC: 6.5 10*3/uL (ref 3.4–10.8)

## 2022-02-07 LAB — COPPER, SERUM: Copper: 113 ug/dL (ref 80–158)

## 2022-02-07 LAB — VITAMIN A: Vitamin A: 41.5 ug/dL (ref 18.9–57.3)

## 2022-02-07 LAB — HEMOGLOBIN A1C
Est. average glucose Bld gHb Est-mCnc: 108 mg/dL
Hgb A1c MFr Bld: 5.4 % (ref 4.8–5.6)

## 2022-02-07 LAB — FERRITIN: Ferritin: 39 ng/mL (ref 15–150)

## 2022-02-07 LAB — VITAMIN D 25 HYDROXY (VIT D DEFICIENCY, FRACTURES): Vit D, 25-Hydroxy: 17.8 ng/mL — ABNORMAL LOW (ref 30.0–100.0)

## 2022-02-07 LAB — PTH, INTACT AND CALCIUM: PTH: 48 pg/mL (ref 15–65)

## 2022-02-07 LAB — TSH: TSH: 2.45 u[IU]/mL (ref 0.450–4.500)

## 2022-02-07 LAB — ZINC: Zinc: 63 ug/dL (ref 44–115)

## 2022-02-07 LAB — VITAMIN B1: Thiamine: 100.2 nmol/L (ref 66.5–200.0)

## 2022-02-07 LAB — B12 AND FOLATE PANEL
Folate: 10.2 ng/mL (ref >3.0)
Vitamin B-12: 384 pg/mL (ref 232–1245)

## 2022-02-09 ENCOUNTER — Other Ambulatory Visit: Payer: Self-pay | Admitting: Nurse Practitioner

## 2022-02-09 LAB — VITAMIN K1, SERUM: VITAMIN K1: 0.95 ng/mL (ref 0.10–2.20)

## 2022-02-09 MED ORDER — VITAMIN D (ERGOCALCIFEROL) 1.25 MG (50000 UNIT) PO CAPS: 50000.0000 [IU] | ORAL_CAPSULE | ORAL | 1 refills | Status: DC

## 2022-02-10 ENCOUNTER — Encounter: Payer: Self-pay | Admitting: Nurse Practitioner

## 2022-02-10 NOTE — Progress Notes (Incomplete)
Barnet Glasgow Martin,acting as a Education administrator for Minette Brine, FNP.,have documented all relevant documentation on the behalf of Minette Brine, FNP,as directed by  Minette Brine, FNP while in the presence of Minette Brine, [place].    Subjective:     Patient ID: Glenda Kim , female    DOB: [birth year] , 21 y.o.   MRN: [id number]   No chief complaint on file.   HPI  Patient presents today to discuss weight loss surgery, patient states she is having weight loss surgery but is not sure when. Patient wants her labs done now. She also complains of trouble with tonsils she states she went to the ER on 01/15/2022 for what they said was strep throat, mono, tonsillitis. Patient states she started antibiotics from urgent care but the ER told her not to take them because they were not helping.  Patient states her bariatrics doctor told her to discontinue a lot of medications.   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 She is to have an appt with nutrition and then will set her date for decision.   She was seen in the ER on 7/23 for sore throat had already been on an antibiotic which was not getting better. When went to er was checked for mono which was positive after stopping the antibiotic her throat improved. No longer has a sore throat but would like to be evaluated by ENT due to frequent problems with her throat.     Past Medical History:  Diagnosis Date  . Anxiety   . Cyclic vomiting syndrome   . Depression   . Endometriosis   . Gastroparesis   . GERD (gastroesophageal reflux disease)      Family History  Problem Relation Age of Onset  . Depression Mother   . Anxiety disorder Mother   . Heart disease Father   . Cancer Father   . Healthy Sister   . Depression Brother   . Anxiety disorder Brother   . Irritable bowel syndrome Maternal  Grandmother   . Crohn's disease Maternal Grandmother   . Cancer Maternal Grandfather   . Hypertension Paternal Grandmother      Current Outpatient Medications:  .  hydrOXYzine (ATARAX) 25 MG tablet, Take 0.5-1 tablets by mouth as needed., Disp: , Rfl:  .  acetaZOLAMIDE (DIAMOX) 250 MG tablet, Take 1 tablet (250 mg total) by mouth 2 (two) times daily. (Patient not taking: Reported on 02/02/2022), Disp: 60 tablet, Rfl: 5 .  metoCLOPramide (REGLAN) 10 MG tablet, Take by mouth. (Patient not taking: Reported on 02/02/2022), Disp: , Rfl:  .  rizatriptan (MAXALT) 10 MG tablet, Take 1 tablet by mouth daily. as needed for Migraine. May repeat in 2 hours if needed (Patient not taking: Reported on 02/02/2022), Disp: , Rfl:  .  sucralfate (CARAFATE) 1 GM/10ML suspension, Take 1 mL by mouth 4 (four) times daily -  before meals and at bedtime. (Patient not taking: Reported on 02/02/2022), Disp: , Rfl:  .  traZODone (DESYREL) 50 MG tablet, Take 1 tablet (50 mg total) by mouth at bedtime as needed for sleep. (Patient not taking: Reported on 02/02/2022), Disp: 30 tablet, Rfl: 1 .  Vitamin D, Ergocalciferol, (DRISDOL) 1.25 MG (50000 UNIT) CAPS capsule, Take 1 capsule (50,000 Units total) by mouth 2 (two) times a week., Disp: 24 capsule, Rfl: 1  Allergies  Allergen Reactions  . Shellfish Allergy Anaphylaxis     Review of Systems  Constitutional: Negative.   HENT: Negative.    Eyes: Negative.   Respiratory: Negative.    Cardiovascular: Negative.   Gastrointestinal: Negative.      Today's Vitals   02/02/22 1620  BP: 120/66  Pulse: 84  Temp: 98.3 F (36.8 C)  TempSrc: Oral  Weight: (!) 440 lb (199.6 kg)  Height: $Remove'5\' 7"'TmVynax$  (1.702 m)  PainSc: 0-No pain   Body mass index is 68.91 kg/m.  Wt Readings from Last 3 Encounters:  02/02/22 (!) 440 lb (199.6 kg)  01/15/22 (!) 442 lb (200.5 kg)  06/30/21 (!) 437 lb 8 oz (198.4 kg)    Objective:  Physical Exam Vitals reviewed.  Constitutional:      General:  She is not in acute distress.    Appearance: Normal appearance. She is well-developed.     Comments: Morbid obese  Cardiovascular:     Rate and Rhythm: Normal rate and regular rhythm.     Pulses: Normal pulses.     Heart sounds: Normal heart sounds. No murmur heard. Pulmonary:     Effort: Pulmonary effort is normal. No respiratory distress.     Breath sounds: Normal breath sounds.  Chest:     Chest wall: No tenderness.  Musculoskeletal:        General: Normal range of motion.  Skin:    General: Skin is warm and dry.     Capillary Refill: Capillary refill takes less than 2 seconds.  Neurological:     General: No focal deficit present.     Mental Status: She is alert and oriented to person, place, and time.     Cranial Nerves: No cranial nerve deficit.     Motor: No weakness.  Psychiatric:        Mood and Affect: Mood normal.        Behavior: Behavior normal.        Thought Content: Thought content normal.        Judgment: Judgment normal.         Assessment And Plan:     1. Sore throat Comments: Continues to have a sore throat and feeling like something is stuck, will refer to ENT for further evaluation - Ambulatory referral to ENT  2. Pre-operative clearance Comments: Will refer to cardiology for pre op clearance, had an abnormal EKG previously. EKG today is shows anterolateral ST elevation. No chest pain/shortness of breath - Ambulatory referral to Cardiology - EKG 12-Lead - Lipid panel - TSH - Vitamin A - VITAMIN D 25 Hydroxy (Vit-D Deficiency, Fractures) - Vitamin E - Vitamin K1, Serum - Zinc - Parathyroid Hormone, Intact w/Ca - Hemoglobin A1c - B12 and Folate Panel - Ferritin - Copper, Serum - CMP14+EGFR - CBC no Diff - Vitamin B1  3. Need for vaccination Will give tetanus vaccine today while in office. Refer to order management. TDAP will be administered to adults 70-77 years old every 10 years. - Tdap vaccine greater than or equal to 7yo IM      Patient was given opportunity to ask questions. Patient verbalized understanding of the plan and was able to repeat key elements of the plan. All questions were answered to their satisfaction.  Minette Brine, FNP   I, Minette Brine, FNP, have reviewed all documentation for this visit. The documentation on 02/02/22 for the exam, diagnosis, procedures, and orders are all accurate and complete.   IF YOU HAVE  BEEN REFERRED TO A SPECIALIST, IT MAY TAKE 1-2 WEEKS TO SCHEDULE/PROCESS THE REFERRAL. IF YOU HAVE NOT HEARD FROM US/SPECIALIST IN TWO WEEKS, PLEASE GIVE [place] A CALL AT [phone].   THE PATIENT IS ENCOURAGED TO PRACTICE SOCIAL DISTANCING DUE TO THE COVID-19 PANDEMIC.

## 2022-02-15 ENCOUNTER — Encounter: Payer: Self-pay | Admitting: Nurse Practitioner

## 2022-02-21 ENCOUNTER — Ambulatory Visit (INDEPENDENT_AMBULATORY_CARE_PROVIDER_SITE_OTHER): Payer: No Typology Code available for payment source | Admitting: Radiology

## 2022-02-21 ENCOUNTER — Encounter: Payer: Self-pay | Admitting: Radiology

## 2022-02-21 VITALS — BP 124/78 | Ht 67.0 in | Wt >= 6400 oz

## 2022-02-21 DIAGNOSIS — Z01419 Encounter for gynecological examination (general) (routine) without abnormal findings: Secondary | ICD-10-CM

## 2022-02-21 DIAGNOSIS — N760 Acute vaginitis: Secondary | ICD-10-CM

## 2022-02-21 NOTE — Progress Notes (Signed)
   Glenda Kim 2000-10-23 [id number]   History:  21 y.o. G0  presents for annual exam. Concerned re: vaginal odor and pH balance being 'off' x 1 month. Sexually active with female partner x 3 years. Weight loss surgery is planned at [facility].  Gynecologic History Patient's last menstrual period was 01/24/2022 (approximate). Period Pattern: (!) Irregular Menstrual Flow: Heavy Menstrual Control: Maxi pad Dysmenorrhea: (!) Moderate Dysmenorrhea Symptoms: Cramping Contraception/Family planning:  female partner Sexually active: yes Last Pap: 2022. Results were: normal   Obstetric History OB History  Gravida Para Term Preterm AB Living  0 0 0 0 0 0  SAB IAB Ectopic Multiple Live Births  0 0 0 0 0     The following portions of the patient's history were reviewed and updated as appropriate: allergies, current medications, past family history, past medical history, past social history, past surgical history, and problem list.  Review of Systems Pertinent items noted in HPI and remainder of comprehensive ROS otherwise negative.   Past medical history, past surgical history, family history and social history were all reviewed and documented in the EPIC chart.   Exam:  Vitals:   02/21/22 1110  BP: 124/78  Weight: (!) 444 lb (201.4 kg)  Height: 5\' 7"  (1.702 m)   Body mass index is 69.54 kg/m.  General appearance:  Normal Thyroid:  Symmetrical, normal in size, without palpable masses or nodularity. Respiratory  Auscultation:  Clear without wheezing or rhonchi Cardiovascular  Auscultation:  Regular rate, without rubs, murmurs or gallops  Edema/varicosities:  Not grossly evident Abdominal  Soft,nontender, without masses, guarding or rebound.  Liver/spleen:  No organomegaly noted  Hernia:  None appreciated  Skin  Inspection:  Grossly normal Breasts: Examined lying and sitting.   Right: Without masses, retractions, nipple discharge or axillary adenopathy.   Left: Without  masses, retractions, nipple discharge or axillary adenopathy. Genitourinary   Inguinal/mons:  Normal without inguinal adenopathy  External genitalia:  Normal appearing vulva with no masses, tenderness, or lesions  BUS/Urethra/Skene's glands:  Normal without masses or exudate  Vagina:  Normal appearing with normal color and discharge, no lesions  Cervix:  Normal appearing without discharge or lesions  Uterus/Adnexa/parametria:  unable to assess due to large habitus   Anus and perineum: Normal   Patient informed chaperone available to be present for breast and pelvic exam. Patient has requested no chaperone to be present. Patient has been advised what will be completed during breast and pelvic exam.   Assessment/Plan:   1. Well woman exam with routine gynecological exam Pap 2022, normal per pt  2. Acute vaginitis [place]     Discussed SBE, pap and STI screening as directed/appropriate. Recommend 2023 of exercise weekly, including weight bearing exercise. Encouraged the use of seatbelts and sunscreen. Return in 1 year for annual or as needed.   B WHNP-BC 11:30 AM 02/21/2022

## 2022-02-22 LAB — SURESWAB® ADVANCED VAGINITIS PLUS,TMA
C. trachomatis RNA, TMA: NOT DETECTED
CANDIDA SPECIES: NOT DETECTED
Candida glabrata: NOT DETECTED
N. gonorrhoeae RNA, TMA: NOT DETECTED
SURESWAB(R) ADV BACTERIAL VAGINOSIS(BV),TMA: NEGATIVE
TRICHOMONAS VAGINALIS (TV),TMA: NOT DETECTED

## 2022-02-23 NOTE — Progress Notes (Signed)
[facility] [address] [place]. [place] [address] [phone]       OFFICE FOLLOW UP NOTE    Glenda Kim Date of Birth:  12/26/2000 Medical Record Number:  [id number]    Primary neurologist: Dr. Vickey Huger Reason for visit: Initial CPAP AND IIH follow-up    SUBJECTIVE:   CHIEF COMPLAINT:  Chief Complaint  Patient presents with   Obstructive Sleep Apnea    Rm 2 alone Pt is well, states mask comes off at night. No other concerns     HPI:   Update 02/28/2022 JM: Patient returns for initial CPAP compliance visit as well as IIH follow-up unaccompanied.    Prior complaints of headache and visual changes.  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 Underwent LP 08/2021 which showed opening pressure of 41 and started on Diamox 250 mg twice daily.  Reports initial improvement of symptoms especially for the first month but then gradually started worsening again. She stopped taking Diamox last month as she ran out of medication, tolerated while taking. She does report occasional headaches and blurred vision.  She was not seen by ophthalmology as advised. She is currently being worked up to undergo bariatric surgery, current BMI 70.64  Completed HST 5/3 which confirms presence of REM sleep dependent OSA and recommended initiating CPAP.  Insurance would not approve in lab titration study therefore AutoPap initiated on 6/14.   Reports difficulty tolerating oral CPAP mask, can come off in the middle of the night while sleeping and at times have issues with leaks/keeping on when laying on her side. She attempted to mask refitting or finding a new mask but was told she had to wait the full 90 days prior to trialing a different mask.  She is otherwise tolerating well and plans to continue CPAP therapy.  Epworth Sleepiness Scale 14/24 (prior to CPAP 12/24).  Fatigue severity scale 30/63 (prior to CPAP 63/63).        Consult visit 06/30/2021 Dr. Vickey Huger: Glenda Kim is a 21 y.o. American female patient and seen on 06/30/2021 from Arnette Felts, FNP,  for a Sleep consultation.  Chief concern according to patient :   Glenda Kim moved here from think of a [place] and was followed by [facility], she relocated to [place] and summer last year and lives here with her female partner she has been working as a Electrical engineer, currently no children, she has a past medical history of gastroparesis frequent vomiting latent nausea and endometriosis with PCOS, irregular menstrual cycle even with birth control pills. She achieved regular menstrual cycles for a while but now they are irregular again.  She is taking hydroxyzine 25 mg as needed rizatriptan for frequent migrainous headaches and she can take up to 2 a day of 10 mg.   She is currently not on sucralfate but has been at the for a while Reglan was given for a while for gastroparesis and she is no longer taking metformin.  Metformin was initiated to control PCOS effect.  Her family practice ordered a sleep study the interpreting physician was Dr. Delynn Flavin school, MD study date was Feb 2nd, 2022 , she was tested by home sleep test and the test showed 16 apneas 7 mixed, 6 obstructive, 3 centrals and 22 hypopneas.  The AHI for hour of sleep was presumed to be 3.6/h.  Oxygen saturation remained stable longest time and oxygen desaturation was only 3/32 total time of 1.7 minutes.  Mean heart rate 77 bpm, so  they did not find a significant amount of apnea.  Looking at the device that was used on this is not specified here I could see that the home sleep test device could not distinguish between REM and non-REM sleep and I wonder if the home sleep test could distinguish between sleep and just recording time.  The patient states that usually she gets just 4 to 6 hours of sleep and that she doubts the validity of the study for that reason.  We will definitely have to evaluate her with a different equipment.    Glenda Kim  has a past medical history of Anxiety, Cyclic vomiting syndrome, Depression, Endometriosis, Gastroparesis, PCOS with insulin restistance, Super obesity, and GERD (gastroesophageal reflux disease). Labs reviewed. 02-2021     Sleep relevant medical history: Daily headaches- weekends and work days, any time, cut down coffee - no changes. Pressure behind the eye, stabbing pain behind the eye. Sharp, nausea. Dizziness, Vertigo.  Cluster , waking her up out of sleep , she feels hot and becomes diaphoretic. Nocturia 2 -3 times - fragmented sleep.  Long periods of wakefulness at night. Sleep talking Startling dreams 1/ month, hypothyroidism. Not diabetic.  Allergic seasonal  rhinitis.   Family medical /sleep history: brother and biological mother CPAP with OSA. She was raised by a paternal aunt.    Social history:  Patient is working as shiftSocial worker ,7 Am to 7 Pm , 5 days a week.  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   and lives in a household with GF and a dog.  Tobacco use: none .  ETOH use less than 1/ week,  Caffeine intake in form of Coffee( 1 day) . Regular exercise in form of walking, patrol.  Hobbies : barber      Sleep habits are as follows: The patient's dinner time is between variable times- snack often in PM. The patient goes to bed at 10.30 PM, but is not asleep until midnight. Cool and quiet bedroom.   After midnight she continues to sleep for 1-2 hours, wakes for unknown reasons, may go to  bathroom.  She may get 4-6 hours total.   The preferred sleep position is sideways, left side-, with the support of 2 pillows.  Dreams are reportedly frequent/vivid.  6.15 AM is the usual rise time. The patient wakes up with an alarm. At 5.40 AM   She reports not feeling refreshed or restored in AM, with symptoms such as dry mouth, epistaxis, morning headaches, and residual fatigue.  Naps are taken infrequently, her work does not permit breaks, she feels less  fatigued after 1 PM. She eats at 11.30-12 noon, but is not tired!  Naps on weekends lasting from 1-3 hours minutes and are more refreshing than nocturnal sleep. They affect night time sleep.     ROS:   14 system review of systems performed and negative with exception of those listed in HPI  PMH:  Past Medical History:  Diagnosis Date   Anxiety    Cyclic vomiting syndrome    Depression    Endometriosis    Gastroparesis    GERD (gastroesophageal reflux disease)    Morbid obesity (HCC)     PSH:  Past Surgical History:  Procedure Laterality Date   CHOLECYSTECTOMY      Social History:  Social History   Socioeconomic History   Marital status: Single    Spouse name: Not on file   Number of children: 0  Years of education: Not on file   Highest education level: High school graduate  Occupational History   Not on file  Tobacco Use   Smoking status: Some Days    Types: E-cigarettes   Smokeless tobacco: Never   Tobacco comments:    VAPES  Vaping Use   Vaping Use: Former  Substance and Sexual Activity   Alcohol use: Yes    Comment: socially   Drug use: Not Currently   Sexual activity: Yes    Partners: Female    Birth control/protection: None  Other Topics Concern   Not on file  Social History Narrative   Lives with partner   Right handed   Caffeine: stopped drinking coffee begging of year, drinks decaf tea and juice   Social Determinants of Health   Financial Resource Strain: Not on file  Food Insecurity: Not on file  Transportation Needs: Not on file  Physical Activity: Not on file  Stress: Not on file  Social Connections: Not on file  Intimate Partner Violence: Not on file    Family History:  Family History  Problem Relation Age of Onset   Depression Mother    Anxiety disorder Mother    Heart disease Father    Cancer Father    Healthy Sister    Depression Brother    Anxiety disorder Brother    Irritable bowel syndrome Maternal Grandmother     Crohn's disease Maternal Grandmother    Cancer Maternal Grandfather    Hypertension Paternal Grandmother     Medications:   Current Outpatient Medications on File Prior to Visit  Medication Sig Dispense Refill   hydrOXYzine (ATARAX) 25 MG tablet Take 0.5-1 tablets by mouth as needed.     Vitamin D, Ergocalciferol, (DRISDOL) 1.25 MG (50000 UNIT) CAPS capsule Take 1 capsule (50,000 Units total) by mouth 2 (two) times a week. 24 capsule 1   No current facility-administered medications on file prior to visit.    Allergies:   Allergies  Allergen Reactions   Shellfish Allergy Anaphylaxis   Bee Pollen Swelling    Pollen General  Pollen General        OBJECTIVE:  Physical Exam  Vitals:   02/28/22 1431  BP: (!) 147/79  Pulse: 76  Weight: (!) 451 lb (204.6 kg)  Height: 5\' 7"  (1.702 m)   Body mass index is 70.64 kg/m. No results found.   General: Morbidly obese very pleasant young female, seated, in no evident distress Head: head normocephalic and atraumatic.   Neck: supple with no carotid or supraclavicular bruits Cardiovascular: regular rate and rhythm, no murmurs Musculoskeletal: no deformity Skin:  no rash/petichiae Vascular:  Normal pulses all extremities   Neurologic Exam Mental Status: Awake and fully alert. Oriented to place and time. Recent and remote memory intact. Attention span, concentration and fund of knowledge appropriate. Mood and affect appropriate.  Cranial Nerves: Pupils equal, briskly reactive to light. Extraocular movements full without nystagmus. Visual fields full to confrontation. Hearing intact. Facial sensation intact. Face, tongue, palate moves normally and symmetrically.  Motor: Normal bulk and tone. Normal strength in all tested extremity muscles Sensory.: intact to touch , pinprick , position and vibratory sensation.  Coordination: Rapid alternating movements normal in all extremities. Finger-to-nose and heel-to-shin performed accurately  bilaterally. Gait and Station: Arises from chair without difficulty. Stance is normal. Gait demonstrates normal stride length and balance without use of AD. Tandem walk and heel toe without difficulty.  Reflexes: 1+ and symmetric. Toes downgoing.  ASSESSMENT/PLAN: Xianna Siverling is a 21 y.o. year old adult .      Pseudotumor cerebri Pressure improvement of symptoms after LP and Diamox but has been gradually worsening Restart Diamox at 500 mg twice daily, advised to call after 1 to 2 weeks if no improvement Ambulatory referral for ophthalmology -advised to call in the next few days to schedule consult visit Currently being worked up for bariatric surgery, current BMI 70.64 Discussed red flag/warning signs and to call 911 immediately if any of these should occur  OSA on CPAP : Compliance report shows poor compliance due to difficulty tolerating current mask.  Per insurance requirements, needed to wait full 90 days prior to trialing a new mask which is next week.  Advised her to contact DME company next week to schedule mask refitting.  Discussed importance of nightly usage with ensuring greater than 4 hours nightly for optimal benefit and per insurance purposes.  Continue to follow with DME company for any needed supplies or CPAP related concerns     Follow up in 3 months or call earlier if needed   CC:  PCP: Arnette Felts, FNP    I spent 34 minutes of face-to-face and non-face-to-face time with patient.  This included previsit chart review, lab review, study review, order entry, electronic health record documentation, patient education regarding diagnosis of sleep apnea with review and discussion of compliance report, IIH, prior testing and treatment plan and answered all other questions to patient's satisfaction   Ihor Austin, AGNP-BC  [facility] [address] [place], [place] [address]  Phone [phone] Fax [phone] Note:  This document was prepared with digital dictation and possible smart phrase technology. Any transcriptional errors that result from this process are unintentional.

## 2022-02-28 ENCOUNTER — Telehealth: Payer: Self-pay | Admitting: Adult Health

## 2022-02-28 ENCOUNTER — Ambulatory Visit (INDEPENDENT_AMBULATORY_CARE_PROVIDER_SITE_OTHER): Payer: No Typology Code available for payment source | Admitting: Adult Health

## 2022-02-28 ENCOUNTER — Encounter: Payer: Self-pay | Admitting: Adult Health

## 2022-02-28 VITALS — BP 147/79 | HR 76 | Ht 67.0 in | Wt >= 6400 oz

## 2022-02-28 DIAGNOSIS — G4733 Obstructive sleep apnea (adult) (pediatric): Secondary | ICD-10-CM | POA: Diagnosis not present

## 2022-02-28 DIAGNOSIS — Z9989 Dependence on other enabling machines and devices: Secondary | ICD-10-CM | POA: Diagnosis not present

## 2022-02-28 DIAGNOSIS — G932 Benign intracranial hypertension: Secondary | ICD-10-CM

## 2022-02-28 MED ORDER — ACETAZOLAMIDE ER 500 MG PO CP12: 500.0000 mg | ORAL_CAPSULE | Freq: Two times a day (BID) | ORAL | 5 refills | Status: DC

## 2022-02-28 NOTE — Telephone Encounter (Signed)
Sent to Dr. Groat ph # [phone] 

## 2022-02-28 NOTE — Patient Instructions (Addendum)
Your Plan:  Restart Diamox at 500mg  twice daily  Please let me know after 1-2 weeks if no benefit, may need to consider repeat LP Any worsening of symptoms, please proceed to ED immediately for further evaluation  Referral placed to Dr. for further visual examination, if you do not hear from him by Friday, please call to schedule [phone]  Continue use of CPAP, follow up with [facility] to discuss new mask/mask refitting     Follow up in 3 months or call earlier if needed     Thank you for coming to see [phone] at [facility]. I hope we have been able to provide you high quality care today.  You may receive a patient satisfaction survey over the next few weeks. We would appreciate your feedback and comments so that we may continue to improve ourselves and the health of our patients.

## 2022-03-01 ENCOUNTER — Telehealth: Payer: Self-pay

## 2022-03-01 ENCOUNTER — Ambulatory Visit: Payer: No Typology Code available for payment source | Admitting: Nurse Practitioner

## 2022-03-01 NOTE — Telephone Encounter (Signed)
Cpap order has been faxed to DME on [facility] on file. New orders have been placed.

## 2022-03-05 NOTE — Progress Notes (Deleted)
Cardiology Office Note:   Date:  03/05/2022  NAME:  Glenda Kim    MRN: [id number] DOB:  [birth year]   PCP:  Arnette Felts, FNP  Cardiologist:  None  Electrophysiologist:  None   Referring MD: Arnette Felts, FNP   No chief complaint on file. ***  History of Present Illness:   Glenda Kim is a 21 y.o. adult with a hx of morbid obesity who is being seen today for the evaluation of preoperative assessment at the request of Arnette Felts, FNP. EKG at PCP office with early repolarization.   Past Medical History: Past Medical History:  Diagnosis Date   Anxiety    Cyclic vomiting syndrome    Depression    Endometriosis    Gastroparesis    GERD (gastroesophageal reflux disease)    Morbid obesity (HCC)     Past Surgical History: Past Surgical History:  Procedure Laterality Date   CHOLECYSTECTOMY      Current Medications: No outpatient medications have been marked as taking for the 03/06/22 encounter (Appointment) with O'Neal, Ronnald Ramp, MD.     Allergies:    Shellfish allergy and Bee pollen   Social History: Social History   Socioeconomic History   Marital status: Single    Spouse name: Not on file   Number of children: 0   Years of education: Not on file   Highest education level: High school graduate  Occupational History   Not on file  Tobacco Use   Smoking status: Some Days    Types: E-cigarettes   Smokeless tobacco: Never   Tobacco comments:    VAPES  Vaping Use   Vaping Use: Former  Substance and Sexual Activity   Alcohol use: Yes    Comment: socially   Drug use: Not Currently   Sexual activity: Yes    Partners: Female    Birth control/protection: None  Other Topics Concern   Not on file  Social History Narrative   Lives with partner   Right handed   Caffeine: stopped drinking coffee begging of year, drinks decaf tea and juice   Social Determinants of Health   Financial Resource Strain: Not on file  Food Insecurity: Not on file   Transportation Needs: Not on file  Physical Activity: Not on file  Stress: Not on file  Social Connections: Not on file     Family History: The patient's ***family history includes Anxiety disorder in [place] "Riley"'s brother and mother; Cancer in [place] "Riley"'s father and maternal grandfather; Crohn's disease in [place] "Riley"'s maternal grandmother; Depression in [place] "Riley"'s brother and mother; Healthy in [place] "Riley"'s sister; Heart disease in [name] "Riley"'s father; Hypertension in [place] "Riley"'s paternal grandmother; Irritable bowel syndrome in [place] "Riley"'s maternal grandmother.  ROS:   All other ROS reviewed and negative. Pertinent positives noted in the HPI.     EKGs/Labs/Other Studies Reviewed:   The following studies were personally reviewed by me today:  EKG:  EKG is *** ordered today.  The ekg ordered today demonstrates ***, and was personally reviewed by me.   Recent Labs: 02/02/2022: ALT 28; BUN 14; Creatinine, Ser 0.80; Hemoglobin 13.1; Platelets 294; Potassium 4.5; Sodium 134; TSH 2.450   Recent Lipid Panel    Component Value Date/Time   CHOL 160 02/02/2022 1726   TRIG 129 02/02/2022 1726   HDL 47 02/02/2022 1726   CHOLHDL 3.4 02/02/2022 1726   LDLCALC 90 02/02/2022 1726    Physical Exam:  VS:  LMP 01/24/2022 (Approximate)    Wt Readings from Last 3 Encounters:  02/28/22 (!) 451 lb (204.6 kg)  02/21/22 (!) 444 lb (201.4 kg)  02/02/22 (!) 440 lb (199.6 kg)    General: Well nourished, well developed, in no acute distress Head: Atraumatic, normal size  Eyes: PEERLA, EOMI  Neck: Supple, no JVD Endocrine: No thryomegaly Cardiac: Normal S1, S2; RRR; no murmurs, rubs, or gallops Lungs: Clear to auscultation bilaterally, no wheezing, rhonchi or rales  Abd: Soft, nontender, no hepatomegaly  Ext: No edema, pulses 2+ Musculoskeletal: No deformities, BUE and BLE strength normal and  equal Skin: Warm and dry, no rashes   Neuro: Alert and oriented to person, place, time, and situation, CNII-XII grossly intact, no focal deficits  Psych: Normal mood and affect   ASSESSMENT:   Betul Brisky is a 21 y.o. adult who presents for the following: No diagnosis found.  PLAN:   There are no diagnoses linked to this encounter.  {Are you ordering a CV Procedure (e.g. stress test, cath, DCCV, TEE, etc)?   Press F2        :[id number]}  Disposition: No follow-ups on file.  Medication Adjustments/Labs and Tests Ordered: Current medicines are reviewed at length with the patient today.  Concerns regarding medicines are outlined above.  No orders of the defined types were placed in this encounter.  No orders of the defined types were placed in this encounter.   There are no Patient Instructions on file for this visit.   Time Spent with Patient: I have spent a total of *** minutes with patient reviewing hospital notes, telemetry, EKGs, labs and examining the patient as well as establishing an assessment and plan that was discussed with the patient.  > 50% of time was spent in direct patient care.  Signed, Glenda Kim. Flora Lipps, MD, [facility]  [facility]  [address] [place], [place] [address] [phone]  03/05/2022 7:26 PM

## 2022-03-06 ENCOUNTER — Ambulatory Visit
Payer: No Typology Code available for payment source | Attending: Cardiovascular Disease | Admitting: Cardiovascular Disease

## 2022-03-06 DIAGNOSIS — Z01810 Encounter for preprocedural cardiovascular examination: Secondary | ICD-10-CM

## 2022-03-21 ENCOUNTER — Telehealth: Payer: Self-pay | Admitting: *Deleted

## 2022-03-21 ENCOUNTER — Other Ambulatory Visit: Payer: Self-pay | Admitting: Adult Health

## 2022-03-21 DIAGNOSIS — H4710 Unspecified papilledema: Secondary | ICD-10-CM

## 2022-03-21 DIAGNOSIS — G932 Benign intracranial hypertension: Secondary | ICD-10-CM

## 2022-03-21 NOTE — Telephone Encounter (Signed)
Noted! Thank you

## 2022-03-21 NOTE — Progress Notes (Signed)
Received results from recent visit with Dr. Katy Fitch who noted definite papilledema OU, blurry disc margins on fundus exam.   Further discussed with Dr. Brett Fairy who recommends STAT LP as pt complains of worsening headache and blurred vision  Please remind the patient the importance of continuing Diamox and do not stop this medication. This condition can lead to permanent blindness if not treated accordingly.

## 2022-03-21 NOTE — Telephone Encounter (Signed)
Faxed order to [facility] scheduling, pt exceeds weight limit at [facility]. Faxed to [phone].

## 2022-03-21 NOTE — Telephone Encounter (Signed)
Called patient and advised her of NP's message:  Received results from recent visit with Dr. Katy Fitch who noted definite papilledema OU, blurry disc margins on fundus exam.    Further discussed with Dr. Brett Fairy who recommends STAT LP as pt complains of worsening headache and blurred vision   Please remind the patient the importance of continuing Diamox and do not stop this medication. This condition can lead to permanent blindness if not treated accordingly. She stated she just got a call from [facility], she will call back now. Patient verbalized understanding, appreciation.

## 2022-03-22 ENCOUNTER — Ambulatory Visit (HOSPITAL_COMMUNITY)
Admission: RE | Admit: 2022-03-22 | Discharge: 2022-03-22 | Disposition: A | Discharge status: Home / Self Care | Payer: No Typology Code available for payment source | Source: Ambulatory Visit | Attending: Adult Health | Admitting: Adult Health

## 2022-03-22 DIAGNOSIS — G932 Benign intracranial hypertension: Secondary | ICD-10-CM

## 2022-03-22 DIAGNOSIS — H4710 Unspecified papilledema: Secondary | ICD-10-CM | POA: Diagnosis present

## 2022-03-22 LAB — GLUCOSE, CSF: Glucose, CSF: 51 mg/dL (ref 40–70)

## 2022-03-22 LAB — PROTEIN, CSF: Total  Protein, CSF: 15 mg/dL (ref 15–45)

## 2022-03-22 MED ORDER — ACETAMINOPHEN 325 MG PO TABS: 650.0000 mg | ORAL_TABLET | ORAL | Status: DC | PRN

## 2022-03-22 MED ORDER — LIDOCAINE HCL (PF) 1 % IJ SOLN
8.0000 mL | Freq: Once | INTRAMUSCULAR | Status: AC
  Administered 2022-03-22: 8 mL via INTRADERMAL

## 2022-03-22 NOTE — Progress Notes (Signed)
Patient was given discharge instructions. She verbalized understanding. 

## 2022-03-22 NOTE — Procedures (Signed)
Initial attempt made at L2-L3 level with no return of CSF. Technically successful fluoro guided LP at L3-L4 level with opening pressure of 27 cm H2O and closing pressure of 18 H2O 18 cc of clear CSF sent to lab for analysis.  No immediate post procedural complication.  Please see imaging section of Epic for full dictation.    Reatha Armour, PA-C 03/22/2022, 10:48 AM

## 2022-03-23 LAB — CSF CELL COUNT WITH DIFFERENTIAL
RBC Count, CSF: 1 /mm3 — ABNORMAL HIGH
Tube #: 3
WBC, CSF: 0 /mm3 (ref 0–5)

## 2022-03-24 ENCOUNTER — Other Ambulatory Visit (HOSPITAL_COMMUNITY): Payer: No Typology Code available for payment source

## 2022-03-26 LAB — CSF CULTURE W GRAM STAIN
Culture: NO GROWTH
Gram Stain: NONE SEEN

## 2022-03-28 LAB — OLIGOCLONAL BANDS, CSF + SERM

## 2022-04-04 ENCOUNTER — Encounter: Payer: No Typology Code available for payment source | Admitting: Nurse Practitioner

## 2022-04-04 NOTE — Progress Notes (Deleted)
    Subjective:     Patient ID: Glenda Kim , adult    DOB: 24-Jul-2000 , 21 y.o.   MRN: [id number]   No chief complaint on file.   HPI  Pt presents today for pre op evaluation.      Past Medical History:  Diagnosis Date   Anxiety    Cyclic vomiting syndrome    Depression    Endometriosis    Gastroparesis    GERD (gastroesophageal reflux disease)    Morbid obesity ([place])      Family History  Problem Relation Age of Onset   Depression Mother    Anxiety disorder Mother    Heart disease Father    Cancer Father    Healthy Sister    Depression Brother    Anxiety disorder Brother    Irritable bowel syndrome Maternal Grandmother    Crohn's disease Maternal Grandmother    Cancer Maternal Grandfather    Hypertension Paternal Grandmother      Current Outpatient Medications:    acetaZOLAMIDE ER (DIAMOX) 500 MG capsule, Take 1 capsule (500 mg total) by mouth 2 (two) times daily., Disp: 60 capsule, Rfl: 5   hydrOXYzine (ATARAX) 25 MG tablet, Take 0.5-1 tablets by mouth as needed., Disp: , Rfl:    Vitamin D, Ergocalciferol, (DRISDOL) 1.25 MG (50000 UNIT) CAPS capsule, Take 1 capsule (50,000 Units total) by mouth 2 (two) times a week., Disp: 24 capsule, Rfl: 1   Allergies  Allergen Reactions   Shellfish Allergy Anaphylaxis   Bee Pollen Swelling    Pollen General  Pollen General       Review of Systems  Constitutional: Negative.   Respiratory: Negative.    Cardiovascular: Negative.   Neurological: Negative.   Psychiatric/Behavioral: Negative.       There were no vitals filed for this visit. There is no height or weight on file to calculate BMI.   Objective:  Physical Exam      Assessment And Plan:     There are no diagnoses linked to this encounter.    Patient was given opportunity to ask questions. Patient verbalized understanding of the plan and was able to repeat key elements of the plan. All questions were answered to their satisfaction.  Debbora Dus, CMA   I, Debbora Dus, CMA, have reviewed all documentation for this visit. The documentation on 04/04/22 for the exam, diagnosis, procedures, and orders are all accurate and complete.   IF YOU HAVE BEEN REFERRED TO A SPECIALIST, IT MAY TAKE 1-2 WEEKS TO SCHEDULE/PROCESS THE REFERRAL. IF YOU HAVE NOT HEARD FROM US/SPECIALIST IN TWO WEEKS, PLEASE GIVE [place] A CALL AT [phone] X [phone].   THE PATIENT IS ENCOURAGED TO PRACTICE SOCIAL DISTANCING DUE TO THE COVID-19 PANDEMIC.

## 2022-04-17 NOTE — Progress Notes (Signed)
Not seen

## 2022-06-05 NOTE — Progress Notes (Deleted)
[facility] [address] [place]. [place] [address] [phone]       OFFICE FOLLOW UP NOTE    Glenda Kim Date of Birth:  Jun 15, 2001 Medical Record Number:  [id number]    Primary neurologist: Dr. Brett Kim Reason for visit: Initial CPAP AND IIH follow-up    SUBJECTIVE:   CHIEF COMPLAINT:  No chief complaint on file.   HPI:   Update 06/06/2022 JM: Patient returns for 69-month follow-up for pseudotumor cerebri  Was restarted on Diamox 500mg  BID at last visit.  Recent ophthalmology exam in September with Dr. Katy Fitch noted definite papilledema OU, proceeded with LP which showed opening pressure of 27.  Advised to continue Diamox.   Headaches and blurred vision ***  Currently being worked up for bariatric surgery through [place] provided for reference purposes only Update 02/28/2022 JM: Patient returns for initial CPAP compliance visit as well as IIH follow-up unaccompanied.    Prior complaints of headache and visual changes.  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 Underwent LP 08/2021 which showed opening pressure of 41 and started on Diamox 250 mg twice daily.  Reports initial improvement of symptoms especially for the first month but then gradually started worsening again. She stopped taking Diamox last month as she ran out of medication, tolerated while taking. She does report occasional headaches and blurred vision.  She was not seen by ophthalmology as advised. She is currently being worked up to undergo bariatric surgery, current BMI 70.64  Completed HST 5/3 which confirms presence of REM sleep dependent OSA and recommended initiating CPAP.  Insurance would not approve in lab titration study therefore AutoPap initiated on 6/14.   Reports difficulty tolerating oral CPAP mask, can come off in the middle of the night while sleeping and at times have issues with leaks/keeping on when laying on her side. She attempted to mask refitting  or finding a new mask but was told she had to wait the full 90 days prior to trialing a different mask.  She is otherwise tolerating well and plans to continue CPAP therapy.  Epworth Sleepiness Scale 14/24 (prior to CPAP 12/24).  Fatigue severity scale 30/63 (prior to CPAP 63/63).        Consult visit 06/30/2021 Dr. Brett Kim: Glenda Kim is a 21 y.o. American female patient and seen on 06/30/2021 from Minette Brine, [place],  for a Sleep consultation.  Chief concern according to patient :   [name] moved here from think of a [place] and was followed by [place], she relocated to [place] and summer last year and lives here with her female partner she has been working as a Presenter, broadcasting, currently no children, she has a past medical history of gastroparesis frequent vomiting latent nausea and endometriosis with PCOS, irregular menstrual cycle even with birth control pills. She achieved regular menstrual cycles for a while but now they are irregular again.  She is taking hydroxyzine 25 mg as needed rizatriptan for frequent migrainous headaches and she can take up to 2 a day of 10 mg.   She is currently not on sucralfate but has been at the for a while Reglan was given for a while for gastroparesis and she is no longer taking metformin.  Metformin was initiated to control PCOS effect.  Her family practice ordered a sleep study the interpreting physician was Dr. Dannielle Huh school, MD study date was Feb 2nd, 2022 , she was tested by home sleep test  and the test showed 16 apneas 7 mixed, 6 obstructive, 3 centrals and 22 hypopneas.  The AHI for hour of sleep was presumed to be 3.6/h.  Oxygen saturation remained stable longest time and oxygen desaturation was only 3/32 total time of 1.7 minutes.  Mean heart rate 77 bpm, so they did not find a significant amount of apnea.  Looking at the device that was used on this is not specified here I could see that the home sleep test device could not  distinguish between REM and non-REM sleep and I wonder if the home sleep test could distinguish between sleep and just recording time.  The patient states that usually she gets just 4 to 6 hours of sleep and that she doubts the validity of the study for that reason.  We will definitely have to evaluate her with a different equipment.   [name]  has a past medical history of Anxiety, Cyclic vomiting syndrome, Depression, Endometriosis, Gastroparesis, PCOS with insulin restistance, Super obesity, and GERD (gastroesophageal reflux disease). Labs reviewed. 02-2021     Sleep relevant medical history: Daily headaches- weekends and work days, any time, cut down coffee - no changes. Pressure behind the eye, stabbing pain behind the eye. Sharp, nausea. Dizziness, Vertigo.  Cluster , waking her up out of sleep , she feels hot and becomes diaphoretic. Nocturia 2 -3 times - fragmented sleep.  Long periods of wakefulness at night. Sleep talking Startling dreams 1/ month, hypothyroidism. Not diabetic.  Allergic seasonal  rhinitis.   Family medical /sleep history: brother and biological mother CPAP with OSA. She was raised by a paternal aunt.    Social history:  Patient is working as shiftSocial worker ,7 Am to 7 Pm , 5 days a week.  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   and lives in a household with GF and a dog.  Tobacco use: none .  ETOH use less than 1/ week,  Caffeine intake in form of Coffee( 1 day) . Regular exercise in form of walking, patrol.  Hobbies : barber      Sleep habits are as follows: The patient's dinner time is between variable times- snack often in PM. The patient goes to bed at 10.30 PM, but is not asleep until midnight. Cool and quiet bedroom.   After midnight she continues to sleep for 1-2 hours, wakes for unknown reasons, may go to  bathroom.  She may get 4-6 hours total.   The preferred sleep position is sideways, left side-, with the support of 2 pillows.   Dreams are reportedly frequent/vivid.  6.15 AM is the usual rise time. The patient wakes up with an alarm. At 5.40 AM   She reports not feeling refreshed or restored in AM, with symptoms such as dry mouth, epistaxis, morning headaches, and residual fatigue.  Naps are taken infrequently, her work does not permit breaks, she feels less fatigued after 1 PM. She eats at 11.30-12 noon, but is not tired!  Naps on weekends lasting from 1-3 hours minutes and are more refreshing than nocturnal sleep. They affect night time sleep.     ROS:   14 system review of systems performed and negative with exception of those listed in HPI  PMH:  Past Medical History:  Diagnosis Date   Anxiety    Cyclic vomiting syndrome    Depression    Endometriosis    Gastroparesis    GERD (gastroesophageal reflux disease)    Morbid  obesity (HCC)     PSH:  Past Surgical History:  Procedure Laterality Date   CHOLECYSTECTOMY      Social History:  Social History   Socioeconomic History   Marital status: Single    Spouse name: Not on file   Number of children: 0   Years of education: Not on file   Highest education level: High school graduate  Occupational History   Not on file  Tobacco Use   Smoking status: Some Days    Types: E-cigarettes   Smokeless tobacco: Never   Tobacco comments:    VAPES  Vaping Use   Vaping Use: Former  Substance and Sexual Activity   Alcohol use: Yes    Comment: socially   Drug use: Not Currently   Sexual activity: Yes    Partners: Female    Birth control/protection: None  Other Topics Concern   Not on file  Social History Narrative   Lives with partner   Right handed   Caffeine: stopped drinking coffee begging of year, drinks decaf tea and juice   Social Determinants of Health   Financial Resource Strain: Not on file  Food Insecurity: Not on file  Transportation Needs: Not on file  Physical Activity: Not on file  Stress: Not on file  Social Connections: Not  on file  Intimate Partner Violence: Not on file    Family History:  Family History  Problem Relation Age of Onset   Depression Mother    Anxiety disorder Mother    Heart disease Father    Cancer Father    Healthy Sister    Depression Brother    Anxiety disorder Brother    Irritable bowel syndrome Maternal Grandmother    Crohn's disease Maternal Grandmother    Cancer Maternal Grandfather    Hypertension Paternal Grandmother     Medications:   Current Outpatient Medications on File Prior to Visit  Medication Sig Dispense Refill   acetaZOLAMIDE ER (DIAMOX) 500 MG capsule Take 1 capsule (500 mg total) by mouth 2 (two) times daily. 60 capsule 5   hydrOXYzine (ATARAX) 25 MG tablet Take 0.5-1 tablets by mouth as needed.     Vitamin D, Ergocalciferol, (DRISDOL) 1.25 MG (50000 UNIT) CAPS capsule Take 1 capsule (50,000 Units total) by mouth 2 (two) times a week. 24 capsule 1   No current facility-administered medications on file prior to visit.    Allergies:   Allergies  Allergen Reactions   Shellfish Allergy Anaphylaxis   Bee Pollen Swelling    Pollen General  Pollen General        OBJECTIVE:  Physical Exam  There were no vitals filed for this visit.  There is no height or weight on file to calculate BMI. No results found.   General: Morbidly obese very pleasant young female, seated, in no evident distress Head: head normocephalic and atraumatic.   Neck: supple with no carotid or supraclavicular bruits Cardiovascular: regular rate and rhythm, no murmurs Musculoskeletal: no deformity Skin:  no rash/petichiae Vascular:  Normal pulses all extremities   Neurologic Exam Mental Status: Awake and fully alert. Oriented to place and time. Recent and remote memory intact. Attention span, concentration and fund of knowledge appropriate. Mood and affect appropriate.  Cranial Nerves: Pupils equal, briskly reactive to light. Extraocular movements full without nystagmus. Visual  fields full to confrontation. Hearing intact. Facial sensation intact. Face, tongue, palate moves normally and symmetrically.  Motor: Normal bulk and tone. Normal strength in all tested extremity muscles Sensory.:  intact to touch , pinprick , position and vibratory sensation.  Coordination: Rapid alternating movements normal in all extremities. Finger-to-nose and heel-to-shin performed accurately bilaterally. Gait and Station: Arises from chair without difficulty. Stance is normal. Gait demonstrates normal stride length and balance without use of AD. Tandem walk and heel toe without difficulty.  Reflexes: 1+ and symmetric. Toes downgoing.         ASSESSMENT/PLAN: Clyta Antonich is a 21 y.o. year old adult .      Pseudotumor cerebri LP 03/22/2022 showed opening pressure of 27 Optho eval 03/19/2022 noted definite papilledema OU Continue Diamox 500 mg twice daily  Pressure improvement of symptoms after LP and Diamox but has been gradually worsening Restart Diamox at 500 mg twice daily, advised to call after 1 to 2 weeks if no improvement Ambulatory referral for ophthalmology -advised to call in the next few days to schedule consult visit Currently being worked up for bariatric surgery, current BMI 70.64 Discussed red flag/warning signs and to call 911 immediately if any of these should occur  OSA on CPAP : Compliance report shows poor compliance due to difficulty tolerating current mask.  Per insurance requirements, needed to wait full 90 days prior to trialing a new mask which is next week.  Advised her to contact DME company next week to schedule mask refitting.  Discussed importance of nightly usage with ensuring greater than 4 hours nightly for optimal benefit and per insurance purposes.  Continue to follow with DME company for any needed supplies or CPAP related concerns     Follow up in 3 months or call earlier if needed   CC:  PCP: Minette Brine, FNP    I spent 34 minutes of  face-to-face and non-face-to-face time with patient.  This included previsit chart review, lab review, study review, order entry, electronic health record documentation, patient education regarding diagnosis of sleep apnea with review and discussion of compliance report, IIH, prior testing and treatment plan and answered all other questions to patient's satisfaction   Frann Rider, AGNP-BC  [facility] [address] [place], [place] [address]  Phone [phone] Fax [phone] Note: This document was prepared with digital dictation and possible smart phrase technology. Any transcriptional errors that result from this process are unintentional.

## 2022-06-06 ENCOUNTER — Ambulatory Visit: Payer: No Typology Code available for payment source | Admitting: Adult Health

## 2022-07-05 ENCOUNTER — Emergency Department (HOSPITAL_COMMUNITY): Payer: No Typology Code available for payment source

## 2022-07-05 ENCOUNTER — Emergency Department (HOSPITAL_COMMUNITY)
Admission: EM | Admit: 2022-07-05 | Discharge: 2022-07-06 | Disposition: A | Discharge status: Home / Self Care | Payer: No Typology Code available for payment source | Attending: Emergency Medicine | Admitting: Emergency Medicine

## 2022-07-05 ENCOUNTER — Encounter (HOSPITAL_COMMUNITY): Payer: Self-pay

## 2022-07-05 ENCOUNTER — Other Ambulatory Visit: Payer: Self-pay

## 2022-07-05 DIAGNOSIS — R1013 Epigastric pain: Secondary | ICD-10-CM | POA: Diagnosis present

## 2022-07-05 DIAGNOSIS — Z9884 Bariatric surgery status: Secondary | ICD-10-CM | POA: Diagnosis not present

## 2022-07-05 DIAGNOSIS — F17290 Nicotine dependence, other tobacco product, uncomplicated: Secondary | ICD-10-CM | POA: Insufficient documentation

## 2022-07-05 DIAGNOSIS — N9489 Other specified conditions associated with female genital organs and menstrual cycle: Secondary | ICD-10-CM | POA: Insufficient documentation

## 2022-07-05 LAB — CBC WITH DIFFERENTIAL/PLATELET
Abs Immature Granulocytes: 0.05 10*3/uL (ref 0.00–0.07)
Basophils Absolute: 0.1 10*3/uL (ref 0.0–0.1)
Basophils Relative: 0 %
Eosinophils Absolute: 0 10*3/uL (ref 0.0–0.5)
Eosinophils Relative: 0 %
HCT: 44.2 % (ref 36.0–46.0)
Hemoglobin: 14.3 g/dL (ref 12.0–15.0)
Immature Granulocytes: 0 %
Lymphocytes Relative: 15 %
Lymphs Abs: 2.1 10*3/uL (ref 0.7–4.0)
MCH: 29.9 pg (ref 26.0–34.0)
MCHC: 32.4 g/dL (ref 30.0–36.0)
MCV: 92.3 fL (ref 80.0–100.0)
Monocytes Absolute: 1.3 10*3/uL — ABNORMAL HIGH (ref 0.1–1.0)
Monocytes Relative: 9 %
Neutro Abs: 10.7 10*3/uL — ABNORMAL HIGH (ref 1.7–7.7)
Neutrophils Relative %: 76 %
Platelets: 268 10*3/uL (ref 150–400)
RBC: 4.79 MIL/uL (ref 3.87–5.11)
RDW: 14.2 % (ref 11.5–15.5)
WBC: 14.2 10*3/uL — ABNORMAL HIGH (ref 4.0–10.5)
nRBC: 0 % (ref 0.0–0.2)

## 2022-07-05 MED ORDER — SODIUM CHLORIDE 0.9 % IV BOLUS
1000.0000 mL | Freq: Once | INTRAVENOUS | Status: AC
  Administered 2022-07-05: 1000 mL via INTRAVENOUS

## 2022-07-05 MED ORDER — LORAZEPAM 2 MG/ML IJ SOLN
1.0000 mg | Freq: Once | INTRAMUSCULAR | Status: AC
  Administered 2022-07-06: 1 mg via INTRAVENOUS
  Filled 2022-07-05: qty 1

## 2022-07-05 MED ORDER — ONDANSETRON HCL 4 MG/2ML IJ SOLN
INTRAMUSCULAR | Status: AC
  Administered 2022-07-05: 4 mg
  Filled 2022-07-05: qty 2

## 2022-07-05 MED ORDER — NALOXONE HCL 0.4 MG/ML IJ SOLN
INTRAMUSCULAR | Status: AC
  Administered 2022-07-05: 0.4 mg
  Filled 2022-07-05: qty 1

## 2022-07-05 MED ORDER — SODIUM CHLORIDE 0.9 % IV SOLN: INTRAVENOUS | Status: DC

## 2022-07-05 MED ORDER — HYDROMORPHONE HCL 1 MG/ML IJ SOLN
1.0000 mg | Freq: Once | INTRAMUSCULAR | Status: AC
  Administered 2022-07-06: 1 mg via INTRAVENOUS
  Filled 2022-07-05: qty 1

## 2022-07-05 NOTE — ED Notes (Signed)
Pt screaming, Dr. Rogene Houston made aware and en route to room.

## 2022-07-05 NOTE — ED Triage Notes (Signed)
BIBA from home for abd pain and vomiting had bariatric surgery 2 weeks ago 150Mcq Fentanyl 4mg  Zofran 252ml NS 22 LFA VSS

## 2022-07-05 NOTE — ED Provider Notes (Signed)
  [facility] DEPT Provider Note   CSN: [id number] Arrival date & time: 07/05/22  1953     History {Add pertinent medical, surgical, social history, OB history to HPI:1} Chief Complaint  Patient presents with   Abdominal Pain    Glenda Kim is a 22 y.o. adult.  HPI     Home Medications Prior to Admission medications   Medication Sig Start Date End Date Taking? Authorizing Provider  acetaZOLAMIDE ER (DIAMOX) 500 MG capsule Take 1 capsule (500 mg total) by mouth 2 (two) times daily. 02/28/22   Frann Rider, NP  hydrOXYzine (ATARAX) 25 MG tablet Take 0.5-1 tablets by mouth as needed. 06/22/21   [provider]  Vitamin D, Ergocalciferol, (DRISDOL) 1.25 MG (50000 UNIT) CAPS capsule Take 1 capsule (50,000 Units total) by mouth 2 (two) times a week. 02/09/22   Minette Brine, FNP      Allergies    Shellfish allergy and Bee pollen    Review of Systems   Review of Systems  Physical Exam Updated Vital Signs BP (!) 110/56   Pulse 96   Temp 99.2 F (37.3 C) (Oral)   Resp 18   Ht 1.702 m (5\' 7" )   Wt (!) 188.7 kg   SpO2 96%   BMI 65.15 kg/m  Physical Exam  ED Results / Procedures / Treatments   Labs (all labs ordered are listed, but only abnormal results are displayed) Labs Reviewed - No data to display  EKG None  Radiology No results found.  Procedures Procedures  {Document cardiac monitor, telemetry assessment procedure when appropriate:1}  Medications Ordered in ED Medications - No data to display  ED Course/ Medical Decision Making/ A&P                           Medical Decision Making  ***  {Document critical care time when appropriate:1} {Document review of labs and clinical decision tools ie heart score, Chads2Vasc2 etc:1}  {Document your independent review of radiology images, and any outside records:1} {Document your discussion with family members, caretakers, and with consultants:1} {Document social  determinants of health affecting pt's care:1} {Document your decision making why or why not admission, treatments were needed:1} Final Clinical Impression(s) / ED Diagnoses Final diagnoses:  None    Rx / DC Orders ED Discharge Orders     None

## 2022-07-06 ENCOUNTER — Encounter (HOSPITAL_COMMUNITY): Payer: Self-pay

## 2022-07-06 ENCOUNTER — Emergency Department (HOSPITAL_COMMUNITY): Payer: No Typology Code available for payment source

## 2022-07-06 LAB — COMPREHENSIVE METABOLIC PANEL
ALT: 76 U/L — ABNORMAL HIGH (ref 0–44)
AST: 40 U/L (ref 15–41)
Albumin: 4.1 g/dL (ref 3.5–5.0)
Alkaline Phosphatase: 78 U/L (ref 38–126)
Anion gap: 14 (ref 5–15)
BUN: 11 mg/dL (ref 6–20)
CO2: 18 mmol/L — ABNORMAL LOW (ref 22–32)
Calcium: 9.3 mg/dL (ref 8.9–10.3)
Chloride: 108 mmol/L (ref 98–111)
Creatinine, Ser: 0.88 mg/dL (ref 0.44–1.00)
GFR, Estimated: >60 mL/min (ref >60)
Glucose, Bld: 95 mg/dL (ref 70–99)
Potassium: 3.9 mmol/L (ref 3.5–5.1)
Sodium: 140 mmol/L (ref 135–145)
Total Bilirubin: 1.2 mg/dL (ref 0.3–1.2)
Total Protein: 7.7 g/dL (ref 6.5–8.1)

## 2022-07-06 LAB — HCG, QUANTITATIVE, PREGNANCY: hCG, Beta Chain, Quant, S: <1 m[IU]/mL (ref <5)

## 2022-07-06 LAB — LIPASE, BLOOD: Lipase: 29 U/L (ref 11–51)

## 2022-07-06 MED ORDER — OXYCODONE-ACETAMINOPHEN 5-325 MG PO TABS
1.0000 | ORAL_TABLET | Freq: Once | ORAL | Status: AC
  Administered 2022-07-06: 1 via ORAL
  Filled 2022-07-06: qty 1

## 2022-07-06 MED ORDER — ONDANSETRON HCL 4 MG/2ML IJ SOLN
4.0000 mg | Freq: Once | INTRAMUSCULAR | Status: AC
  Administered 2022-07-06: 4 mg via INTRAVENOUS
  Filled 2022-07-06: qty 2

## 2022-07-06 MED ORDER — SCOPOLAMINE 1 MG/3DAYS TD PT72
1.0000 | MEDICATED_PATCH | TRANSDERMAL | Status: DC
  Administered 2022-07-06: 1.5 mg via TRANSDERMAL
  Filled 2022-07-06: qty 1

## 2022-07-06 MED ORDER — SODIUM CHLORIDE (PF) 0.9 % IJ SOLN
INTRAMUSCULAR | Status: AC
  Filled 2022-07-06: qty 50

## 2022-07-06 MED ORDER — IOHEXOL 300 MG/ML  SOLN
100.0000 mL | Freq: Once | INTRAMUSCULAR | Status: AC | PRN
  Administered 2022-07-06: 100 mL via INTRAVENOUS

## 2022-07-06 MED ORDER — MORPHINE SULFATE (PF) 2 MG/ML IV SOLN
2.0000 mg | Freq: Once | INTRAVENOUS | Status: AC
  Administered 2022-07-06: 2 mg via INTRAVENOUS
  Filled 2022-07-06: qty 1

## 2022-07-06 NOTE — ED Provider Notes (Addendum)
I assumed care of this patient.  Please see previous provider note for further details of Hx, PE.  Briefly patient is a 22 y.o. adult who presented abd pain, N/V s/p duodenal switch 2 weeks ago. Pending CT.  CT without acute complications. Patient treated with IV fluids, additional antiemetics.  Given oral pain medicine. Consulted [facility] and spoke with the PA who was updated.  They requested patient be transferred to [facility] for admission for additional IV hydration under Dr. Cletus Gash.  Will coordinate transfer.  If there is prolonged wait.  They recommended discharging the patient and having her follow-up in clinic later on today.  They will attempt to direct admit at that time.  [facility] on call [phone] Transfer line #: [phone]    Fatima Blank, MD 07/06/22 0557    Fatima Blank, MD 07/06/22 [phone]  Spoke with Dr. Hervey Ard from [facility] who also accepted patient. Provided additional recommendations including scopolamine patch and scheduled Reglan or Zofran. They will work on getting a bed. Plan is the same regarding wait times. Will call again around noon to determine position on a wait list.  If patient at that time is stable and maintaining intake.  She can be discharged and they will see the patient in clinic.  Patient care turned over to oncoming provider. Patient case and results discussed in detail; please see their note for further ED managment.      Fatima Blank, MD 07/06/22 [phone]

## 2022-07-06 NOTE — ED Provider Notes (Signed)
We checked on the patient has a bed over at the [facility].  She does not have a bed and will be discharged home to go to the clinic to see her surgeon   Milton Ferguson, MD 07/06/22 1118

## 2022-07-06 NOTE — ED Notes (Signed)
Patient transported to CT 

## 2022-07-06 NOTE — Discharge Instructions (Signed)
Go to your doctor's office after you leave here

## 2022-07-06 NOTE — ED Notes (Signed)
Pt.ambulated to the bathroom without assistance and back to her room without any complaints. Pt. states feels much better after bowel movement.

## 2022-07-06 NOTE — ED Notes (Deleted)
Called [facility] to arrange transport, they added pt to waitlist & requested that any imaging that has been done be powershared to them, Pt is to be transported with facesheet.

## 2022-07-10 ENCOUNTER — Telehealth: Payer: Self-pay

## 2022-07-10 NOTE — Telephone Encounter (Signed)
Transition Care Management Unsuccessful Follow-up Telephone Call  Date of discharge and from where:  07/06/2022 [place]  Attempts:  1st Attempt  Reason for unsuccessful TCM follow-up call:  Left voice message

## 2022-07-25 ENCOUNTER — Telehealth: Payer: Self-pay

## 2022-07-25 NOTE — Telephone Encounter (Signed)
Transition Care Management Unsuccessful Follow-up Telephone Call  Date of discharge and from where:  [facility] 07-26-41 dx: cyclic vomitting syndrome   Attempts:  1st Attempt  Reason for unsuccessful TCM follow-up call:  Missing or invalid number   [place] Direct Dial [phone]

## 2022-07-26 NOTE — Telephone Encounter (Signed)
Transition Care Management Unsuccessful Follow-up Telephone Call  Date of discharge and from where:    [facility] 3-35-45 dx: cyclic vomitting syndrome     Attempts:  2nd Attempt  Reason for unsuccessful TCM follow-up call:  Left voice message  Pt to fu with Bariatric clinic 08-29-22-   Juanda Crumble LPN [place] Direct Dial [phone]

## 2022-08-01 ENCOUNTER — Emergency Department (HOSPITAL_COMMUNITY)
Admission: EM | Admit: 2022-08-01 | Discharge: 2022-08-02 | Disposition: A | Discharge status: Home / Self Care | Payer: No Typology Code available for payment source | Attending: Emergency Medicine | Admitting: Emergency Medicine

## 2022-08-01 ENCOUNTER — Other Ambulatory Visit: Payer: Self-pay

## 2022-08-01 DIAGNOSIS — E860 Dehydration: Secondary | ICD-10-CM | POA: Insufficient documentation

## 2022-08-01 DIAGNOSIS — Z1152 Encounter for screening for COVID-19: Secondary | ICD-10-CM | POA: Insufficient documentation

## 2022-08-01 DIAGNOSIS — R1115 Cyclical vomiting syndrome unrelated to migraine: Secondary | ICD-10-CM | POA: Diagnosis not present

## 2022-08-01 DIAGNOSIS — R1084 Generalized abdominal pain: Secondary | ICD-10-CM | POA: Insufficient documentation

## 2022-08-01 DIAGNOSIS — R1110 Vomiting, unspecified: Secondary | ICD-10-CM | POA: Diagnosis present

## 2022-08-01 LAB — CBC WITH DIFFERENTIAL/PLATELET
Abs Immature Granulocytes: 0.03 K/uL (ref 0.00–0.07)
Basophils Absolute: 0 K/uL (ref 0.0–0.1)
Basophils Relative: 0 %
Eosinophils Absolute: 0.1 K/uL (ref 0.0–0.5)
Eosinophils Relative: 1 %
HCT: 49.5 % — ABNORMAL HIGH (ref 36.0–46.0)
Hemoglobin: 16.3 g/dL — ABNORMAL HIGH (ref 12.0–15.0)
Immature Granulocytes: 0 %
Lymphocytes Relative: 19 %
Lymphs Abs: 1.8 K/uL (ref 0.7–4.0)
MCH: 30.8 pg (ref 26.0–34.0)
MCHC: 32.9 g/dL (ref 30.0–36.0)
MCV: 93.4 fL (ref 80.0–100.0)
Monocytes Absolute: 1.1 K/uL — ABNORMAL HIGH (ref 0.1–1.0)
Monocytes Relative: 12 %
Neutro Abs: 6.4 K/uL (ref 1.7–7.7)
Neutrophils Relative %: 68 %
Platelets: 251 K/uL (ref 150–400)
RBC: 5.3 MIL/uL — ABNORMAL HIGH (ref 3.87–5.11)
RDW: 14.9 % (ref 11.5–15.5)
WBC: 9.4 K/uL (ref 4.0–10.5)
nRBC: 0 % (ref 0.0–0.2)

## 2022-08-01 LAB — LACTIC ACID, PLASMA: Lactic Acid, Venous: 1.7 mmol/L (ref 0.5–1.9)

## 2022-08-01 MED ORDER — LACTATED RINGERS IV BOLUS
1000.0000 mL | Freq: Once | INTRAVENOUS | Status: AC
  Administered 2022-08-02: 1000 mL via INTRAVENOUS

## 2022-08-01 MED ORDER — DROPERIDOL 2.5 MG/ML IJ SOLN
2.5000 mg | Freq: Once | INTRAMUSCULAR | Status: AC
  Administered 2022-08-02: 2.5 mg via INTRAVENOUS
  Filled 2022-08-01: qty 2

## 2022-08-01 NOTE — ED Triage Notes (Signed)
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

## 2022-08-01 NOTE — ED Provider Notes (Signed)
[facility]GENC[facility] DEPARTMENT AT [facility] Provider Note   CSN: [id number] Arrival date & time: 08/01/22  2312     History {Add pertinent medical, surgical, social history, OB history to HPI:1} Chief Complaint  Patient presents with   Emesis    Glenda Kim is a 22 y.o. adult.  The history is provided by the patient and medical records.  Emesis Glenda Kim is a 22 y.o. adult who presents to the Emergency Department complaining of vomiting.  She presents to the emergency department for evaluation of vomiting that started on January 30.  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  She states that she was recently admitted to the hospital the 24th to the 30th and was feeling well at time of discharge only to develop recurrent emesis.  She reports numerous episodes of emesis and is unable to tolerate any food by mouth.  2 days ago she did have a small amount of flecks of blood in her vomit and developed a temperature to 105 at home.  She complains of generalized abdominal soreness that radiates to her chest.  No dysuria but has dark urine.  She does cough right when she goes to vomit.  She also complains of watery yellow stool.     Home Medications Prior to Admission medications   Medication Sig Start Date End Date Taking? Authorizing Provider  acetaminophen (TYLENOL) 500 MG tablet Take 500 mg by mouth daily as needed for moderate pain.    [provider]  acetaZOLAMIDE ER (DIAMOX) 500 MG capsule Take 1 capsule (500 mg total) by mouth 2 (two) times daily. Patient not taking: Reported on 07/06/2022 02/28/22   Frann Rider, NP  apixaban (ELIQUIS) 2.5 MG TABS tablet Take 2.5 mg by mouth 2 (two) times daily. 06/27/22 07/27/22  [provider]  Calcium Carbonate (CALCIUM 500 PO) Take 500 mg by mouth in the morning, at noon, in the evening, and at bedtime.    [provider]  Cholecalciferol (VITAMIN D-3 PO) Take 1 capsule by mouth  daily.    [provider]  docusate sodium (COLACE) 50 MG capsule Take 50 mg by mouth daily.    [provider]  doxylamine, Sleep, (UNISOM) 25 MG tablet Take 25 mg by mouth at bedtime. 06/27/22 07/11/22  [provider]  Ferrous Sulfate (IRON PO) Take 1 tablet by mouth at bedtime.    [provider]  Multiple Vitamins-Minerals (MULTIVITAMIN WITH MINERALS) tablet Take 1 tablet by mouth in the morning and at bedtime.    [provider]  omeprazole (PRILOSEC) 20 MG capsule Take 20 mg by mouth 2 (two) times daily before a meal. 06/27/22   [provider]  ondansetron (ZOFRAN) 4 MG tablet Take 4 mg by mouth 2 (two) times daily as needed for vomiting or nausea. 06/27/22   [provider]  oxyCODONE (OXY IR/ROXICODONE) 5 MG immediate release tablet Take 5 mg by mouth daily. 06/27/22   [provider]  promethazine (PHENERGAN) 25 MG tablet Take 25 mg by mouth at bedtime. 06/27/22   [provider]      Allergies    Shellfish allergy and Bee pollen    Review of Systems   Review of Systems  Gastrointestinal:  Positive for vomiting.  All other systems reviewed and are negative.   Physical Exam Updated Vital Signs BP (!) 130/104   Pulse 92   Temp 98.5 F (36.9 C) (Oral)   Resp 18  Ht 5\' 7"  (1.702 m)   Wt (!) 171.5 kg   SpO2 96%   BMI 59.20 kg/m  Physical Exam Vitals and nursing note reviewed.  Constitutional:      Appearance: Glenda Contes "Ovid Curd" is well-developed.  HENT:     Head: Normocephalic and atraumatic.  Cardiovascular:     Rate and Rhythm: Normal rate and regular rhythm.     Heart sounds: No murmur heard. Pulmonary:     Effort: Pulmonary effort is normal. No respiratory distress.     Breath sounds: Normal breath sounds.  Abdominal:     Palpations: Abdomen is soft.     Tenderness: There is no guarding or rebound.     Comments: Mild generalized abdominal tenderness  Musculoskeletal:        General:  No swelling or tenderness.  Skin:    General: Skin is warm and dry.  Neurological:     Mental Status: Glenda Contes "Ovid Curd" is alert and oriented to person, place, and time.  Psychiatric:        Behavior: Behavior normal.     ED Results / Procedures / Treatments   Labs (all labs ordered are listed, but only abnormal results are displayed) Labs Reviewed - No data to display  EKG None  Radiology No results found.  Procedures Procedures  {Document cardiac monitor, telemetry assessment procedure when appropriate:1}  Medications Ordered in ED Medications - No data to display  ED Course/ Medical Decision Making/ A&P   {   Click here for ABCD2, HEART and other calculatorsREFRESH Note before signing :1}                          Medical Decision Making  ***  {Document critical care time when appropriate:1} {Document review of labs and clinical decision tools ie heart score, Chads2Vasc2 etc:1}  {Document your independent review of radiology images, and any outside records:1} {Document your discussion with family members, caretakers, and with consultants:1} {Document social determinants of health affecting pt's care:1} {Document your decision making why or why not admission, treatments were needed:1} Final Clinical Impression(s) / ED Diagnoses Final diagnoses:  None    Rx / DC Orders ED Discharge Orders     None

## 2022-08-02 LAB — BLOOD CULTURE ID PANEL (REFLEXED) - BCID2

## 2022-08-02 LAB — RESP PANEL BY RT-PCR (RSV, FLU A&B, COVID)  RVPGX2
Influenza A by PCR: NEGATIVE
Influenza B by PCR: NEGATIVE
Resp Syncytial Virus by PCR: NEGATIVE
SARS Coronavirus 2 by RT PCR: NEGATIVE

## 2022-08-02 LAB — COMPREHENSIVE METABOLIC PANEL
ALT: 199 U/L — ABNORMAL HIGH (ref 0–44)
AST: 71 U/L — ABNORMAL HIGH (ref 15–41)
Albumin: 4.6 g/dL (ref 3.5–5.0)
Alkaline Phosphatase: 64 U/L (ref 38–126)
Anion gap: 18 — ABNORMAL HIGH (ref 5–15)
BUN: 8 mg/dL (ref 6–20)
CO2: 22 mmol/L (ref 22–32)
Calcium: 9.8 mg/dL (ref 8.9–10.3)
Chloride: 97 mmol/L — ABNORMAL LOW (ref 98–111)
Creatinine, Ser: 0.97 mg/dL (ref 0.44–1.00)
GFR, Estimated: >60 mL/min (ref >60)
Glucose, Bld: 93 mg/dL (ref 70–99)
Potassium: 4 mmol/L (ref 3.5–5.1)
Sodium: 137 mmol/L (ref 135–145)
Total Bilirubin: 2.2 mg/dL — ABNORMAL HIGH (ref 0.3–1.2)
Total Protein: 8.3 g/dL — ABNORMAL HIGH (ref 6.5–8.1)

## 2022-08-02 LAB — LIPASE, BLOOD: Lipase: 32 U/L (ref 11–51)

## 2022-08-02 MED ORDER — PROMETHAZINE HCL 25 MG PO TABS: 25.0000 mg | ORAL_TABLET | Freq: Three times a day (TID) | ORAL | 0 refills | Status: DC | PRN

## 2022-08-02 MED ORDER — LACTATED RINGERS IV BOLUS
1000.0000 mL | Freq: Once | INTRAVENOUS | Status: AC
  Administered 2022-08-02: 1000 mL via INTRAVENOUS

## 2022-08-02 MED ORDER — DEXTROSE-NACL 5-0.45 % IV SOLN: INTRAVENOUS | Status: DC

## 2022-08-02 NOTE — ED Notes (Signed)
Patient tolerating oral fluids  

## 2022-08-03 ENCOUNTER — Telehealth: Payer: Self-pay

## 2022-08-03 NOTE — Telephone Encounter (Signed)
Transition Care Management Unsuccessful Follow-up Telephone Call  Date of discharge and from where:  08/02/2022 [place]  Attempts:  1st Attempt  Reason for unsuccessful TCM follow-up call:  Left voice message also sent mychart message.

## 2022-08-04 ENCOUNTER — Emergency Department (HOSPITAL_COMMUNITY): Payer: No Typology Code available for payment source

## 2022-08-04 ENCOUNTER — Emergency Department (HOSPITAL_COMMUNITY)
Admission: EM | Admit: 2022-08-04 | Discharge: 2022-08-05 | Disposition: A | Discharge status: Other Acute Inpatient Hospital | Payer: No Typology Code available for payment source | Attending: Emergency Medicine | Admitting: Emergency Medicine

## 2022-08-04 ENCOUNTER — Encounter (HOSPITAL_COMMUNITY): Payer: Self-pay

## 2022-08-04 ENCOUNTER — Other Ambulatory Visit: Payer: Self-pay

## 2022-08-04 DIAGNOSIS — R000 Tachycardia, unspecified: Secondary | ICD-10-CM | POA: Diagnosis not present

## 2022-08-04 DIAGNOSIS — Z7901 Long term (current) use of anticoagulants: Secondary | ICD-10-CM | POA: Insufficient documentation

## 2022-08-04 DIAGNOSIS — E876 Hypokalemia: Secondary | ICD-10-CM | POA: Insufficient documentation

## 2022-08-04 DIAGNOSIS — R112 Nausea with vomiting, unspecified: Secondary | ICD-10-CM | POA: Diagnosis present

## 2022-08-04 DIAGNOSIS — R1084 Generalized abdominal pain: Secondary | ICD-10-CM | POA: Diagnosis not present

## 2022-08-04 LAB — COMPREHENSIVE METABOLIC PANEL
ALT: 194 U/L — ABNORMAL HIGH (ref 0–44)
AST: 63 U/L — ABNORMAL HIGH (ref 15–41)
Albumin: 3.8 g/dL (ref 3.5–5.0)
Alkaline Phosphatase: 56 U/L (ref 38–126)
Anion gap: 14 (ref 5–15)
BUN: 6 mg/dL (ref 6–20)
CO2: 24 mmol/L (ref 22–32)
Calcium: 9.1 mg/dL (ref 8.9–10.3)
Chloride: 98 mmol/L (ref 98–111)
Creatinine, Ser: 0.82 mg/dL (ref 0.44–1.00)
GFR, Estimated: >60 mL/min (ref >60)
Glucose, Bld: 94 mg/dL (ref 70–99)
Potassium: 3.2 mmol/L — ABNORMAL LOW (ref 3.5–5.1)
Sodium: 136 mmol/L (ref 135–145)
Total Bilirubin: 1.2 mg/dL (ref 0.3–1.2)
Total Protein: 6.7 g/dL (ref 6.5–8.1)

## 2022-08-04 LAB — CBC WITH DIFFERENTIAL/PLATELET
Abs Immature Granulocytes: 0.02 10*3/uL (ref 0.00–0.07)
Basophils Absolute: 0 10*3/uL (ref 0.0–0.1)
Basophils Relative: 0 %
Eosinophils Absolute: 0.1 10*3/uL (ref 0.0–0.5)
Eosinophils Relative: 1 %
HCT: 43.4 % (ref 36.0–46.0)
Hemoglobin: 14.3 g/dL (ref 12.0–15.0)
Immature Granulocytes: 0 %
Lymphocytes Relative: 20 %
Lymphs Abs: 1.4 10*3/uL (ref 0.7–4.0)
MCH: 31.2 pg (ref 26.0–34.0)
MCHC: 32.9 g/dL (ref 30.0–36.0)
MCV: 94.8 fL (ref 80.0–100.0)
Monocytes Absolute: 1 10*3/uL (ref 0.1–1.0)
Monocytes Relative: 15 %
Neutro Abs: 4.5 10*3/uL (ref 1.7–7.7)
Neutrophils Relative %: 64 %
Platelets: 194 10*3/uL (ref 150–400)
RBC: 4.58 MIL/uL (ref 3.87–5.11)
RDW: 14.8 % (ref 11.5–15.5)
WBC: 7 10*3/uL (ref 4.0–10.5)
nRBC: 0 % (ref 0.0–0.2)

## 2022-08-04 LAB — I-STAT BETA HCG BLOOD, ED (MC, WL, AP ONLY): I-stat hCG, quantitative: <5 m[IU]/mL (ref <5)

## 2022-08-04 LAB — LIPASE, BLOOD: Lipase: 31 U/L (ref 11–51)

## 2022-08-04 LAB — CULTURE, BLOOD (ROUTINE X 2)

## 2022-08-04 MED ORDER — IOHEXOL 300 MG/ML  SOLN
100.0000 mL | Freq: Once | INTRAMUSCULAR | Status: AC | PRN
  Administered 2022-08-04: 100 mL via INTRAVENOUS

## 2022-08-04 MED ORDER — ONDANSETRON HCL 4 MG/2ML IJ SOLN
4.0000 mg | Freq: Once | INTRAMUSCULAR | Status: AC
  Administered 2022-08-04: 4 mg via INTRAVENOUS
  Filled 2022-08-04: qty 2

## 2022-08-04 MED ORDER — SODIUM CHLORIDE 0.9 % IV BOLUS
1000.0000 mL | Freq: Once | INTRAVENOUS | Status: AC
  Administered 2022-08-04: 1000 mL via INTRAVENOUS

## 2022-08-04 MED ORDER — SODIUM CHLORIDE 0.9 % IV SOLN
12.5000 mg | Freq: Once | INTRAVENOUS | Status: AC
  Administered 2022-08-04: 12.5 mg via INTRAVENOUS
  Filled 2022-08-04: qty 12.5

## 2022-08-04 NOTE — ED Notes (Signed)
Blood taken to [facility] lab.

## 2022-08-04 NOTE — ED Triage Notes (Signed)
Pt states has not been eating for the past week. Pt has bariatric surgery and having lost of issues since it. Talked to her providers at [place] and they advised her to come to hospital for fluids and nutrition.

## 2022-08-04 NOTE — ED Provider Notes (Signed)
[facility] Provider Note   CSN: [id number] Arrival date & time: 08/04/22  1711     History  Chief Complaint  Patient presents with   Emesis    Glenda Kim is a 22 y.o. adult.  HPI   22 year old female who is status post duodenal switch by Dr. Cletus Gash (with [facility]) presents to the emergency department with ongoing nausea/vomiting.  Patient states for the past week she has been and able to tolerate any thing p.o.  She was seen here 2 days ago, treated symptomatically and sent home with nausea medicine.  She has had no improvement of symptoms.  Patient has mild generalized abdominal discomfort associated with vomiting but denies any other acute pain.  No fever or diarrhea.  Home Medications Prior to Admission medications   Medication Sig Start Date End Date Taking? Authorizing Provider  acetaminophen (TYLENOL) 500 MG tablet Take 500 mg by mouth daily as needed for moderate pain.    [provider]  acetaZOLAMIDE ER (DIAMOX) 500 MG capsule Take 1 capsule (500 mg total) by mouth 2 (two) times daily. Patient not taking: Reported on 07/06/2022 02/28/22   Frann Rider, NP  apixaban (ELIQUIS) 2.5 MG TABS tablet Take 2.5 mg by mouth 2 (two) times daily. 06/27/22 07/27/22  [provider]  Calcium Carbonate (CALCIUM 500 PO) Take 500 mg by mouth in the morning, at noon, in the evening, and at bedtime.    [provider]  Cholecalciferol (VITAMIN D-3 PO) Take 1 capsule by mouth daily.    [provider]  docusate sodium (COLACE) 50 MG capsule Take 50 mg by mouth daily.    [provider]  doxylamine, Sleep, (UNISOM) 25 MG tablet Take 25 mg by mouth at bedtime. 06/27/22 07/11/22  [provider]  Ferrous Sulfate (IRON PO) Take 1 tablet by mouth at bedtime.    [provider]  Multiple Vitamins-Minerals (MULTIVITAMIN WITH MINERALS) tablet Take 1 tablet by mouth in the  morning and at bedtime.    [provider]  omeprazole (PRILOSEC) 20 MG capsule Take 20 mg by mouth 2 (two) times daily before a meal. 06/27/22   [provider]  ondansetron (ZOFRAN) 4 MG tablet Take 4 mg by mouth 2 (two) times daily as needed for vomiting or nausea. 06/27/22   [provider]  oxyCODONE (OXY IR/ROXICODONE) 5 MG immediate release tablet Take 5 mg by mouth daily. 06/27/22   [provider]  promethazine (PHENERGAN) 25 MG tablet Take 25 mg by mouth at bedtime. 06/27/22   [provider]  promethazine (PHENERGAN) 25 MG tablet Take 1 tablet (25 mg total) by mouth every 8 (eight) hours as needed for nausea or vomiting. 08/02/22   Quintella Reichert, MD      Allergies    Shellfish allergy and Bee pollen    Review of Systems   Review of Systems  Constitutional:  Positive for appetite change and fatigue. Negative for fever.  Respiratory:  Negative for shortness of breath.   Cardiovascular:  Negative for chest pain.  Gastrointestinal:  Positive for abdominal pain, nausea and vomiting. Negative for diarrhea.  Skin:  Negative for rash.  Neurological:  Negative for headaches.    Physical Exam Updated Vital Signs BP (!) 121/92   Pulse 75   Temp 98.3 F (36.8 C) (Oral)   Resp 16   SpO2 99%  Physical Exam Vitals and nursing note reviewed.  Constitutional:  General: Glenda Contes "Ovid Curd" is not in acute distress.    Appearance: Normal appearance.  HENT:     Head: Normocephalic.     Mouth/Throat:     Mouth: Mucous membranes are moist.  Cardiovascular:     Rate and Rhythm: Normal rate.  Pulmonary:     Effort: Pulmonary effort is normal. No respiratory distress.  Abdominal:     Palpations: Abdomen is soft.     Tenderness: There is no abdominal tenderness. There is no guarding or rebound.  Skin:    General: Skin is warm.  Neurological:     Mental Status: Glenda Contes "Ovid Curd" is alert and oriented to person, place, and time. Mental  status is at baseline.  Psychiatric:        Mood and Affect: Mood normal.     ED Results / Procedures / Treatments   Labs (all labs ordered are listed, but only abnormal results are displayed) Labs Reviewed  COMPREHENSIVE METABOLIC PANEL - Abnormal; Notable for the following components:      Result Value   Potassium 3.2 (*)    AST 63 (*)    ALT 194 (*)    All other components within normal limits  CBC WITH DIFFERENTIAL/PLATELET  LIPASE, BLOOD  I-STAT BETA HCG BLOOD, ED ([facility], [facility], [facility] ONLY)    EKG None  Radiology No results found.  Procedures Procedures    Medications Ordered in ED Medications  sodium chloride 0.9 % bolus 1,000 mL (1,000 mLs Intravenous New Bag/Given 08/04/22 1917)  promethazine (PHENERGAN) 12.5 mg in sodium chloride 0.9 % 50 mL IVPB (0 mg Intravenous Stopped 08/04/22 2013)    ED Course/ Medical Decision Making/ A&P                             Medical Decision Making Amount and/or Complexity of Data Reviewed Labs: ordered. Radiology: ordered.  Risk Prescription drug management.   22 year old female who is postop gastric bypass with [facility] presents emergency department with ongoing intractable vomiting.  Not improved with home symptomatic treatment.  Tachycardic on arrival, afebrile, soft abdomen.  Blood work shows mild hypokalemia.  Patient continues to be nauseous and not tolerating p.o. after IV medicine.  Spoke with on-call bariatric surgeon, Dr. Hervey Ard.  He recommends transfer to [place].  They are excepting, we are pending transfer.  Patient stable at time of transfer.  Patients evaluation and results requires admission for further treatment and care.  Spoke with surgeon Dr. Hervey Ard, reviewed patient's ED course and they accept admission.  Patient agrees with admission plan, offers no new complaints and is stable/unchanged at time of admit.        Final Clinical Impression(s) / ED Diagnoses Final diagnoses:  None    Rx  / DC Orders ED Discharge Orders     None         Lorelle Gibbs, DO 08/04/22 2304

## 2022-08-05 ENCOUNTER — Telehealth (HOSPITAL_BASED_OUTPATIENT_CLINIC_OR_DEPARTMENT_OTHER): Payer: Self-pay | Admitting: *Deleted

## 2022-08-05 NOTE — Progress Notes (Signed)
ED Antimicrobial Stewardship Positive Culture Follow Up   Glenda Kim is an 22 y.o. adult who presented to [facility] on 08/04/2022 with a chief complaint of  Chief Complaint  Patient presents with   Emesis    Recent Results (from the past 720 hour(s))  Culture, blood (routine x 2)     Status: Abnormal   Collection Time: 08/01/22 11:30 PM   Specimen: BLOOD LEFT FOREARM  Result Value Ref Range Status   Specimen Description   Final    BLOOD LEFT FOREARM Performed at [facility], [place] [address], [place], [place] [address]    Special Requests   Final    BOTTLES DRAWN AEROBIC AND ANAEROBIC Blood Culture results may not be optimal due to an inadequate volume of blood received in culture bottles Performed at [place] [address] [place], [place] [address]    Culture  Setup Time   Final    GRAM POSITIVE COCCI IN CLUSTERS IN BOTH AEROBIC AND ANAEROBIC BOTTLES CRITICAL RESULT CALLED TO, READ BACK BY AND VERIFIED WITH: [place] RN 08/02/22 @ 2258 BY AB    Culture (A)  Final    STAPHYLOCOCCUS EPIDERMIDIS THE SIGNIFICANCE OF ISOLATING THIS ORGANISM FROM A SINGLE VENIPUNCTURE CANNOT BE PREDICTED WITHOUT FURTHER CLINICAL AND CULTURE CORRELATION. SUSCEPTIBILITIES AVAILABLE ONLY ON REQUEST. Performed at [facility], [place] [address], [place], [place] [address]    Report Status 08/04/2022 FINAL  Final  Blood Culture ID Panel (Reflexed)     Status: Abnormal   Collection Time: 08/01/22 11:30 PM  Result Value Ref Range Status   Enterococcus faecalis NOT DETECTED NOT DETECTED Final   Enterococcus Faecium NOT DETECTED NOT DETECTED Final   Listeria monocytogenes NOT DETECTED NOT DETECTED Final   Staphylococcus species DETECTED (A) NOT DETECTED Final    Comment: CRITICAL RESULT CALLED TO, READ BACK BY AND VERIFIED WITH: TJ RIVERS RN 08/02/22 @ 2258 BY AB    Staphylococcus aureus (BCID) NOT DETECTED NOT DETECTED Final   Staphylococcus epidermidis  DETECTED (A) NOT DETECTED Final    Comment: Methicillin (oxacillin) resistant coagulase negative staphylococcus. Possible blood culture contaminant (unless isolated from more than one blood culture draw or clinical case suggests pathogenicity). No antibiotic treatment is indicated for blood  culture contaminants. CRITICAL RESULT CALLED TO, READ BACK BY AND VERIFIED WITH: TJ RIVERS RN 08/02/22 @ 2258 BY AB    Staphylococcus lugdunensis NOT DETECTED NOT DETECTED Final   Streptococcus species NOT DETECTED NOT DETECTED Final   Streptococcus agalactiae NOT DETECTED NOT DETECTED Final   Streptococcus pneumoniae NOT DETECTED NOT DETECTED Final   Streptococcus pyogenes NOT DETECTED NOT DETECTED Final   A.calcoaceticus-baumannii NOT DETECTED NOT DETECTED Final   Bacteroides fragilis NOT DETECTED NOT DETECTED Final   Enterobacterales NOT DETECTED NOT DETECTED Final   Enterobacter cloacae complex NOT DETECTED NOT DETECTED Final   Escherichia coli NOT DETECTED NOT DETECTED Final   Klebsiella aerogenes NOT DETECTED NOT DETECTED Final   Klebsiella oxytoca NOT DETECTED NOT DETECTED Final   Klebsiella pneumoniae NOT DETECTED NOT DETECTED Final   Proteus species NOT DETECTED NOT DETECTED Final   Salmonella species NOT DETECTED NOT DETECTED Final   Serratia marcescens NOT DETECTED NOT DETECTED Final   Haemophilus influenzae NOT DETECTED NOT DETECTED Final   Neisseria meningitidis NOT DETECTED NOT DETECTED Final   Pseudomonas aeruginosa NOT DETECTED NOT DETECTED Final   Stenotrophomonas maltophilia NOT DETECTED NOT DETECTED Final   Candida albicans NOT DETECTED NOT DETECTED  Final   Candida auris NOT DETECTED NOT DETECTED Final   Candida glabrata NOT DETECTED NOT DETECTED Final   Candida krusei NOT DETECTED NOT DETECTED Final   Candida parapsilosis NOT DETECTED NOT DETECTED Final   Candida tropicalis NOT DETECTED NOT DETECTED Final   Cryptococcus neoformans/gattii NOT DETECTED NOT DETECTED Final    Methicillin resistance mecA/C DETECTED (A) NOT DETECTED Final    Comment: CRITICAL RESULT CALLED TO, READ BACK BY AND VERIFIED WITH: TJ RIVERS RN 08/02/22 @ 2258 BY AB Performed at [facility], [place] [address] [place], [place] [address]   Resp panel by RT-PCR (RSV, Flu A&B, Covid) Anterior Nasal Swab     Status: None   Collection Time: 08/01/22 11:41 PM   Specimen: Anterior Nasal Swab  Result Value Ref Range Status   SARS Coronavirus 2 by RT PCR NEGATIVE NEGATIVE Final    Comment: (NOTE) SARS-CoV-2 target nucleic acids are NOT DETECTED.  The SARS-CoV-2 RNA is generally detectable in upper respiratory specimens during the acute phase of infection. The lowest concentration of SARS-CoV-2 viral copies this assay can detect is 138 copies/mL. A negative result does not preclude SARS-Cov-2 infection and should not be used as the sole basis for treatment or other patient management decisions. A negative result may occur with  improper specimen collection/handling, submission of specimen other than nasopharyngeal swab, presence of viral mutation(s) within the areas targeted by this assay, and inadequate number of viral copies(<138 copies/mL). A negative result must be combined with clinical observations, patient history, and epidemiological information. The expected result is Negative.  Fact Sheet for Patients:  EntrepreneurPulse.com.au  Fact Sheet for Healthcare Providers:  IncredibleEmployment.be  This test is no t yet approved or cleared by the [place] FDA and  has been authorized for detection and/or diagnosis of SARS-CoV-2 by FDA under an Emergency Use Authorization (EUA). This EUA will remain  in effect (meaning this test can be used) for the duration of the COVID-19 declaration under Section 564(b)(1) of the Act, 21 U.S.C.section 360bbb-3(b)(1), unless the authorization is terminated  or revoked sooner.       Influenza A by PCR  NEGATIVE NEGATIVE Final   Influenza B by PCR NEGATIVE NEGATIVE Final    Comment: (NOTE) The Xpert Xpress SARS-CoV-2/FLU/RSV plus assay is intended as an aid in the diagnosis of influenza from Nasopharyngeal swab specimens and should not be used as a sole basis for treatment. Nasal washings and aspirates are unacceptable for Xpert Xpress SARS-CoV-2/FLU/RSV testing.  Fact Sheet for Patients: EntrepreneurPulse.com.au  Fact Sheet for Healthcare Providers: IncredibleEmployment.be  This test is not yet approved or cleared by the [place] FDA and has been authorized for detection and/or diagnosis of SARS-CoV-2 by FDA under an Emergency Use Authorization (EUA). This EUA will remain in effect (meaning this test can be used) for the duration of the COVID-19 declaration under Section 564(b)(1) of the Act, 21 U.S.C. section 360bbb-3(b)(1), unless the authorization is terminated or revoked.     Resp Syncytial Virus by PCR NEGATIVE NEGATIVE Final    Comment: (NOTE) Fact Sheet for Patients: EntrepreneurPulse.com.au  Fact Sheet for Healthcare Providers: IncredibleEmployment.be  This test is not yet approved or cleared by the [place] FDA and has been authorized for detection and/or diagnosis of SARS-CoV-2 by FDA under an Emergency Use Authorization (EUA). This EUA will remain in effect (meaning this test can be used) for the duration of the COVID-19 declaration under Section 564(b)(1) of the Act, 21 U.S.C. section 360bbb-3(b)(1), unless  the authorization is terminated or revoked.  Performed at [facility], [place] [address] [place], [place] [address]     Pt presented to [facility] ED with N/V s/p bariatric surgery. Bariatric surgery done at [facility]. Pt transferred from [facility] ED to [place].   BCID+ 2/2 staph epi, mecA detected. I called and spoke with clinical pharmacist at [facility] who confirmed  that pt is still admitted. No fax number. Information regarding blood cultures reported over the phone. Pharmacist to relay information to treatment team.  ED Provider: Theodis Blaze, PA-C  Lenis Noon, PharmD 08/05/2022, 12:01 PM Clinical Pharmacist [phone]

## 2022-08-05 NOTE — Telephone Encounter (Signed)
Post ED Visit - Positive Culture Follow-up  Culture report reviewed by antimicrobial stewardship pharmacist: [place] Team []$  [facility], Pharm.D. []$  Heide Guile, Pharm.D., BCPS AQ-ID []$  Parks Neptune, Pharm.D., BCPS []$  Alycia Rossetti, Pharm.D., BCPS []$  [place], Pharm.D., BCPS, AAHIVP []$  Legrand Como, Pharm.D., BCPS, AAHIVP []$  Salome Arnt, PharmD, BCPS []$  Johnnette Gourd, PharmD, BCPS []$  Hughes Better, PharmD, BCPS []$  Leeroy Cha, PharmD []$  Laqueta Linden, PharmD, BCPS []$  Albertina Parr, PharmD  [place] Team []$  Leodis Sias, PharmD []$  Lindell Spar, PharmD []$  Royetta Asal, PharmD []$  Graylin Shiver, Rph []$  Rema Fendt) Glennon Mac, PharmD []$  Arlyn Dunning, PharmD []$  Netta Cedars, PharmD []$  Dia Sitter, PharmD []$  Leone [place], PharmD []$  Gretta Arab, PharmD [x]$  Theodis Shove, PharmD []$  Peggyann Juba, PharmD []$  Reuel Boom, PharmD   Positive blood culture Transferred from ED to [facility].  Inpatient pharmacy at [place] notified and will relay to treatment team.  No further patient follow-up is required at this time.  Harlon Flor [facility] 08/05/2022, 12:28 PM

## 2022-08-07 ENCOUNTER — Telehealth: Payer: Self-pay

## 2022-08-07 NOTE — Transitions of Care (Post Inpatient/ED Visit) (Signed)
   08/07/2022  Name: Glenda Kim MRN: [id number] DOB: [birth year]  Today's TOC FU Call Status: Today's TOC FU Call Status:: Unsuccessul Call (1st Attempt) Unsuccessful Call (1st Attempt) Date: 08/07/22  Attempted to reach the patient regarding the most recent Inpatient/ED visit.  Follow Up Plan: Additional outreach attempts will be made to reach the patient to complete the Transitions of Care (Post Inpatient/ED visit) call.   Signature Tami Lin, [place]

## 2022-08-10 ENCOUNTER — Telehealth: Payer: Self-pay

## 2022-08-10 NOTE — Transitions of Care (Post Inpatient/ED Visit) (Signed)
   08/10/2022  Name: Glenda Kim MRN: [id number] DOB: 2000-10-25  Today's TOC FU Call Status: Today's TOC FU Call Status:: Successful TOC FU Call Competed  Transition Care Management Follow-up Telephone Call Date of Discharge: 08/09/22 Discharge Facility: Other Facility Name of Other Discharge Facility: [facility] Type of Discharge: Inpatient Admission Primary Inpatient Discharge Diagnosis:: post op n/v How have you been since you were released from the hospital?: Better Any questions or concerns?: No  Items Reviewed: Did you receive and understand the discharge instructions provided?: Yes Medications obtained and verified?: Yes (Medications Reviewed) Any new allergies since your discharge?: No Dietary orders reviewed?: NA Do you have support at home?: Yes  Home Care and Equipment/Supplies: [place] Ordered?: NA Any new equipment or medical supplies ordered?: NA  Functional Questionnaire: Do you need assistance with bathing/showering or dressing?: No Do you need assistance with meal preparation?: No Do you need assistance with eating?: No Do you have difficulty maintaining continence: No Do you need assistance with getting out of bed/getting out of a chair/moving?: No Do you have difficulty managing or taking your medications?: No  Folllow up appointments reviewed: PCP Follow-up appointment confirmed?: No MD Provider Line Number:[phone] Given: Yes [facility] Follow-up appointment confirmed?: Yes Date of Specialist follow-up appointment?: 08/29/22 Follow-Up Specialty Provider:: Dr Cletus Gash Do you need transportation to your follow-up appointment?: No Do you understand care options if your condition(s) worsen?: Yes-patient verbalized understanding    [place] LPN [place] Direct Dial [phone]

## 2022-09-07 ENCOUNTER — Telehealth: Payer: Self-pay

## 2022-09-07 NOTE — Transitions of Care (Post Inpatient/ED Visit) (Deleted)
   09/07/2022  Name: Glenda Kim MRN: [id number] DOB: Oct 09, 2000  {[id number]}

## 2022-09-07 NOTE — Transitions of Care (Post Inpatient/ED Visit) (Signed)
   09/07/2022  Name: Glenda Kim MRN: [id number] DOB: [birth year]  Today's TOC FU Call Status: Today's TOC FU Call Status:: Unsuccessul Call (1st Attempt) Unsuccessful Call (1st Attempt) Date: 09/07/22  Attempted to reach the patient regarding the most recent Inpatient/ED visit.  Follow Up Plan: Additional outreach attempts will be made to reach the patient to complete the Transitions of Care (Post Inpatient/ED visit) call.   [place] LPN [place] Advisor Direct Dial [phone]

## 2022-09-11 NOTE — Transitions of Care (Post Inpatient/ED Visit) (Signed)
   09/11/2022  Name: Glenda Kim MRN: [id number] DOB: Oct 21, 2000  Today's TOC FU Call Status: Today's TOC FU Call Status:: Unsuccessful Call (2nd Attempt) Unsuccessful Call (1st Attempt) Date: 09/07/22 Unsuccessful Call (2nd Attempt) Date: 09/11/22  Attempted to reach the patient regarding the most recent Inpatient/ED visit.  Follow Up Plan: No further outreach attempts will be made at this time. We have been unable to contact the patient.  [place] LPN [place] Advisor Direct Dial [phone]

## 2022-10-06 ENCOUNTER — Encounter (HOSPITAL_COMMUNITY): Payer: Self-pay

## 2022-10-06 ENCOUNTER — Other Ambulatory Visit: Payer: Self-pay

## 2022-10-06 ENCOUNTER — Emergency Department (HOSPITAL_COMMUNITY)
Admission: EM | Admit: 2022-10-06 | Discharge: 2022-10-06 | Disposition: A | Discharge status: Home / Self Care | Payer: No Typology Code available for payment source | Attending: Emergency Medicine | Admitting: Emergency Medicine

## 2022-10-06 DIAGNOSIS — T80211A Bloodstream infection due to central venous catheter, initial encounter: Secondary | ICD-10-CM | POA: Insufficient documentation

## 2022-10-06 DIAGNOSIS — Z7901 Long term (current) use of anticoagulants: Secondary | ICD-10-CM | POA: Diagnosis not present

## 2022-10-06 DIAGNOSIS — T80219A Unspecified infection due to central venous catheter, initial encounter: Secondary | ICD-10-CM

## 2022-10-06 DIAGNOSIS — Y829 Unspecified medical devices associated with adverse incidents: Secondary | ICD-10-CM | POA: Insufficient documentation

## 2022-10-06 DIAGNOSIS — Z452 Encounter for adjustment and management of vascular access device: Secondary | ICD-10-CM | POA: Insufficient documentation

## 2022-10-06 DIAGNOSIS — R7881 Bacteremia: Secondary | ICD-10-CM

## 2022-10-06 MED ORDER — LINEZOLID 600 MG PO TABS: 600.0000 mg | ORAL_TABLET | Freq: Two times a day (BID) | ORAL | 0 refills | Status: AC

## 2022-10-06 NOTE — ED Triage Notes (Signed)
PICC line to left upper arm, pt states that surgeon's office said it was infected per blood cultures that were drawn a few days ago (staph and MRSA). Pt started cefdinir today. Pt states surgeon wants repeat blood cultures done in a few days. Denies fever, chills, body aches.

## 2022-10-06 NOTE — Discharge Instructions (Addendum)
It was a pleasure taking care of you today.  As discussed, your blood cultures were positive which typically requires admission for IV antibiotics.  However, you prefered to be discharged home with oral antibiotics.  Please return to the ER if you develop a fever or other concerning symptoms.  Please discuss with your surgeon about next steps in regards to repeating blood cultures.  Return to the ER for any worsening symptoms.

## 2022-10-06 NOTE — ED Provider Notes (Addendum)
[place] EMERGENCY DEPARTMENT AT [facility] Provider Note   CSN: [id number] Arrival date & time: 10/06/22  1100     History  Chief Complaint  Patient presents with   Vascular Access Problem    Glenda Kim is a 22 y.o. 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  Patient was advised by her bariatric surgeon to report to the ED to have her PICC line removed due to concerns about infection.  Patient denies any fever.  Patient's home health nurse came to her house on 4/10 where her nurse was concerned about possible PICC line infection.  Patient then reported to the ED in [facility] on 4/10 where blood cultures were obtained which just came back positive for staphylococcus epidermidis and MRSA.  Patient states she had generalized weakness on 4/10 however has resolved.  Denies any recent fever.  No purulent drainage from PICC line site.  Patient has not used her PICC line since 4/10. Patient's surgeon requested patient have repeat blood cultures on Monday.   History obtained from patient and past medical records. No interpreter used during encounter.       Home Medications Prior to Admission medications   Medication Sig Start Date End Date Taking? Authorizing Provider  acetaminophen (TYLENOL) 500 MG tablet Take 500 mg by mouth daily as needed for moderate pain.    [provider]  acetaZOLAMIDE ER (DIAMOX) 500 MG capsule Take 1 capsule (500 mg total) by mouth 2 (two) times daily. 02/28/22   Ihor Austin, NP  apixaban (ELIQUIS) 2.5 MG TABS tablet Take 2.5 mg by mouth 2 (two) times daily. 06/27/22 08/10/22  [provider]  Calcium Carbonate (CALCIUM 500 PO) Take 500 mg by mouth in the morning, at noon, in the evening, and at bedtime.    [provider]   Cholecalciferol (VITAMIN D-3 PO) Take 1 capsule by mouth daily.    [provider]  docusate sodium (COLACE) 50 MG capsule Take 50 mg by mouth daily.    [provider]  doxylamine, Sleep, (UNISOM) 25 MG tablet Take 25 mg by mouth at bedtime. 06/27/22 08/10/22  [provider]  Ferrous Sulfate (IRON PO) Take 1 tablet by mouth at bedtime.    [provider]  Multiple Vitamins-Minerals (MULTIVITAMIN WITH MINERALS) tablet Take 1 tablet by mouth in the morning and at bedtime.    [provider]  omeprazole (PRILOSEC) 20 MG capsule Take 20 mg by mouth 2 (two) times daily before a meal. 06/27/22   [provider]  ondansetron (ZOFRAN) 4 MG tablet Take 4 mg by mouth 2 (two) times daily as needed for vomiting or nausea. 06/27/22   [provider]  oxyCODONE (OXY IR/ROXICODONE) 5 MG immediate release tablet Take 5 mg by mouth daily. 06/27/22   [provider]  promethazine (PHENERGAN) 25 MG tablet Take 25 mg by mouth at bedtime. 06/27/22   [provider]  promethazine (PHENERGAN) 25 MG tablet Take 1 tablet (25 mg total) by mouth every 8 (eight) hours as needed for nausea or vomiting. 08/02/22   Tilden Fossa, MD      Allergies    Shellfish allergy and Bee pollen    Review of Systems   Review of Systems  Constitutional:  Negative for chills and fever.  Respiratory:  Negative for shortness of breath.  Cardiovascular:  Negative for chest pain.  Gastrointestinal:  Negative for abdominal pain.    Physical Exam Updated Vital Signs BP 133/75 (BP Location: Right Wrist)   Pulse 65   Temp 98.7 F (37.1 C) (Oral)   Resp 17   Ht  (1.702 m)   Wt (!) 168.7 kg   SpO2 100%   BMI 58.26 kg/m  Physical Exam Vitals and nursing note reviewed.  Constitutional:      General: [name] is not in acute distress.    Appearance: [name] is not ill-appearing.  HENT:     Head: Normocephalic.  Eyes:      Pupils: Pupils are equal, round, and reactive to light.  Cardiovascular:     Rate and Rhythm: Normal rate and regular rhythm.     Pulses: Normal pulses.     Heart sounds: Normal heart sounds. No murmur heard.    No friction rub. No gallop.  Pulmonary:     Effort: Pulmonary effort is normal.     Breath sounds: Normal breath sounds.  Abdominal:     General: Abdomen is flat. There is no distension.     Palpations: Abdomen is soft.     Tenderness: There is no abdominal tenderness. There is no guarding or rebound.  Musculoskeletal:        General: Normal range of motion.     Cervical back: Neck supple.  Skin:    General: Skin is warm and dry.     Comments: PICC line to LUE. No erythema or purulent drainage. See photos below.   Neurological:     General: No focal deficit present.     Mental Status: [name] is alert.  Psychiatric:        Mood and Affect: Mood normal.        Behavior: Behavior normal.        ED Results / Procedures / Treatments   Labs (all labs ordered are listed, but only abnormal results are displayed) Labs Reviewed - No data to display  EKG None  Radiology No results found.  Procedures .Foreign Body Removal  Date/Time: 10/06/2022 1:57 PM  Performed by: Mannie Stabile, PA-C Authorized by: Mannie Stabile, PA-C  Consent: Verbal consent obtained. Written consent obtained. Consent given by: patient Patient understanding: patient states understanding of the procedure being performed Patient consent: the patient's understanding of the procedure matches consent given Procedure consent: procedure consent matches procedure scheduled Relevant documents: relevant documents present and verified Test results: test results available and properly labeled Site marked: the operative site was marked Imaging studies: imaging studies available Patient identity confirmed: verbally with patient Time out: Immediately prior to procedure a "time  out" was called to verify the correct patient, procedure, equipment, support staff and site/side marked as required. Body area: skin General location: upper extremity Location details: left upper arm  Sedation: Patient sedated: no  Patient restrained: no Patient cooperative: yes Complexity: complex 1 objects recovered. Objects recovered: PICC line Post-procedure assessment: foreign body removed      Medications Ordered in ED Medications - No data to display  ED Course/ Medical Decision Making/ A&P                             Medical Decision Making Amount and/or Complexity of Data Reviewed Independent Historian: friend External Data Reviewed: notes.    Details: 4/41 note   22 year old female presents to the  ED due to positive blood cultures.  Patient was advised by her bariatric surgeon to report to the ED to have her PICC line removed.  Started on cefdinir by her surgeon today.  No recent fevers.  Patient seen on the ED on 4/10 due to concerns about PICC line infection where blood cultures were drawn which came back positive for MRSA and staphylococcus epidermidis. Reviewed labs from 4/10.  No leukocytosis.  Upon arrival, patient is afebrile, not tachycardic or hypoxic.  Patient in no acute distress. Patient well appearing. Insertion site of PICC line without any erythema or purulent drainage.  PICC line removed after discussion with Dr. Adela Lank without any complications as noted above. Bleeding stopped after pressure. No signs of infection. Patient prefers to be discharged home and continue taking oral antibiotics with close follow-up with her surgeon and repeat blood culture on 4/15 per request of surgeon. Patient non-toxic appearing.Patient does not look septic at this point. Recheck of vitals remain stable. Patient advised to return to the ER if she develops a fever or other concerning symptoms of sepsis. Patient stable for discharge. Strict ED precautions discussed with patient.  Patient states understanding and agrees to plan. Patient discharged home in no acute distress and stable vitals  Has PCP  Discussed with Dr. Adela Lank who agrees with assessment and plan.    Addendum: Discussed with pharmacy who recommends switching patient from cefdinir to linezolid. Called patient to make her aware of this change. Prescription sent to pharmacy. She was able to get in touch with surgeon who also recommended switching to same antibiotics. Surgeon did not believe patient needed to be admitted per patient.   Final Clinical Impression(s) / ED Diagnoses Final diagnoses:  Infection of peripherally inserted central catheter (PICC), initial encounter  Positive blood cultures    Rx / DC Orders ED Discharge Orders     None         Mannie Stabile, PA-C 10/06/22 1432    Mannie Stabile, PA-C 10/06/22 1442    Melene Plan, DO 10/06/22 1447

## 2022-10-09 ENCOUNTER — Emergency Department (HOSPITAL_COMMUNITY)
Admission: EM | Admit: 2022-10-09 | Discharge: 2022-10-09 | Disposition: A | Discharge status: Home / Self Care | Payer: No Typology Code available for payment source | Attending: Student | Admitting: Student

## 2022-10-09 ENCOUNTER — Encounter (HOSPITAL_COMMUNITY): Payer: Self-pay

## 2022-10-09 DIAGNOSIS — R55 Syncope and collapse: Secondary | ICD-10-CM | POA: Insufficient documentation

## 2022-10-09 DIAGNOSIS — I951 Orthostatic hypotension: Secondary | ICD-10-CM

## 2022-10-09 DIAGNOSIS — F17290 Nicotine dependence, other tobacco product, uncomplicated: Secondary | ICD-10-CM | POA: Insufficient documentation

## 2022-10-09 MED ORDER — LACTATED RINGERS IV BOLUS
1000.0000 mL | Freq: Once | INTRAVENOUS | Status: AC
  Administered 2022-10-09: 1000 mL via INTRAVENOUS

## 2022-10-09 NOTE — ED Provider Triage Note (Signed)
Emergency Medicine Provider Triage Evaluation Note  Glenda Kim , a 22 y.o. adult  was evaluated in triage.  Pt complains of concerned about blood infection.  Patient reports that she is currently being treated with linezolid for an infection from her PICC line has been removed.  She denies any recent fevers but reports that she had a syncopal episode earlier today while at work.  She does report that she is not keeping up well with her nutrition and this may have played a role in her syncope.  Has been taking antibiotic as prescribed.  States that her surgeon advised her to present for further evaluation to have a blood culture performed.  Patient currently appears comfortable and is hemodynamically stable..  Review of Systems  Positive: As above Negative: As above  Physical Exam  BP 138/79 (BP Location: Left Arm)   Pulse 69   Temp 98.4 F (36.9 C) (Oral)   Resp 18   SpO2 100%  Gen:   Awake, no distress   Resp:  Normal effort  MSK:   Moves extremities without difficulty  Other:  Skin at the site of the PICC line appears to be healing well without any obvious signs of infection  Medical Decision Making  Medically screening exam initiated at 8:12 PM.  Appropriate orders placed.  Oneal Gerbitz was informed that the remainder of the evaluation will be completed by another provider, this initial triage assessment does not replace that evaluation, and the importance of remaining in the ED until their evaluation is complete.     Smitty Knudsen, PA-C 10/09/22 2013

## 2022-10-09 NOTE — ED Triage Notes (Signed)
Pt states that she had an infection from a PICC line and had infection from it, currently on antibiotics. Was on TPN through the PICC line. Today had near syncopal episode

## 2022-10-10 LAB — CULTURE, BLOOD (ROUTINE X 2)

## 2022-10-10 NOTE — ED Provider Notes (Signed)
[place] EMERGENCY DEPARTMENT AT [facility] Provider Note  CSN: [id number] Arrival date & time: 10/09/22 1922  Chief Complaint(s) Blood Infection  HPI Glenda Kim is a 22 y.o. 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  Patient was seen on 10/06/2022 for positive cultures showing Staph epidermidis.  Patient is currently taking linezolid and had PICC line removed at last ER presentation.  At work today, patient stood up quickly from a sitting position and had a syncopal episode.  She states that she has been able to take p.o. since removal of her PICC line but does feel little dehydrated.  She currently states that she feels back to normal with no complaints of chest pain, shortness of breath, abdominal pain, nausea, vomiting, fever, chills or other systemic symptoms.  Patient initially went to [facility] where labs were obtained showing a white count of 5.56, hemoglobin 12.8, sodium 141, potassium 3.8, chloride 108, BUN 6, creatinine 0.85 with normal LFTs.  She left without being seen due to long wait times.   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 Authorizing Provider  acetaminophen (TYLENOL) 500 MG tablet Take 500 mg by mouth daily as needed for moderate pain.    [provider]  acetaZOLAMIDE ER (DIAMOX) 500 MG capsule Take 1 capsule (500 mg total) by mouth 2 (two) times daily. 02/28/22   Ihor Austin, NP  apixaban (ELIQUIS) 2.5 MG TABS tablet Take 2.5 mg by mouth 2 (two) times daily. 06/27/22 08/10/22  [provider]  Calcium Carbonate (CALCIUM 500 PO) Take 500 mg by mouth in the morning, at noon, in the evening, and at bedtime.    [provider]  Cholecalciferol (VITAMIN D-3 PO) Take 1 capsule by mouth daily.    [provider]  docusate sodium (COLACE) 50 MG capsule Take 50 mg by mouth daily.    [provider]  doxylamine, Sleep, (UNISOM) 25 MG tablet Take 25 mg by mouth at bedtime. 06/27/22 08/10/22  [provider]  Ferrous Sulfate (IRON PO) Take 1 tablet by mouth at bedtime.    [provider]  linezolid (ZYVOX) 600 MG tablet Take 1 tablet (600 mg total) by mouth 2 (two) times daily for 14 days. 10/06/22 10/20/22  Mannie Stabile, PA-C  Multiple Vitamins-Minerals (MULTIVITAMIN WITH MINERALS) tablet Take 1 tablet by mouth in the morning and at bedtime.    [provider]  omeprazole (PRILOSEC) 20 MG capsule Take 20 mg by mouth 2 (two) times daily before a meal. 06/27/22  [provider]  ondansetron (ZOFRAN) 4 MG tablet Take 4 mg by mouth 2 (two) times daily as needed for vomiting or nausea. 06/27/22   [provider]  oxyCODONE (OXY IR/ROXICODONE) 5 MG immediate release tablet Take 5 mg by mouth daily. 06/27/22   [provider]  promethazine (PHENERGAN) 25 MG tablet Take 25 mg by mouth at bedtime. 06/27/22   [provider]  promethazine  (PHENERGAN) 25 MG tablet Take 1 tablet (25 mg total) by mouth every 8 (eight) hours as needed for nausea or vomiting. 08/02/22   Tilden Fossa, MD                                                                                                                                    Past Surgical History Past Surgical History:  Procedure Laterality Date   CHOLECYSTECTOMY     Family History Family History  Problem Relation Age of Onset   Depression Mother    Anxiety disorder Mother    Heart disease Father    Cancer Father    Healthy Sister    Depression Brother    Anxiety disorder Brother    Irritable bowel syndrome Maternal Grandmother    Crohn's disease Maternal Grandmother    Cancer Maternal Grandfather    Hypertension Paternal Grandmother     Social History Social History   Tobacco Use   Smoking status: Some Days    Types: E-cigarettes   Smokeless tobacco: Never   Tobacco comments:    VAPES  Vaping Use   Vaping Use: Former  Substance Use Topics   Alcohol use: Yes    Comment: socially   Drug use: Not Currently   Allergies Shellfish allergy and Bee pollen  Review of Systems Review of Systems  Neurological:  Positive for syncope.    Physical Exam Vital Signs  I have reviewed the triage vital signs BP 122/70   Pulse (!) 51   Temp 98.4 F (36.9 C) (Oral)   Resp 19   SpO2 100%   Physical Exam Constitutional:      General: [name] is not in acute distress.    Appearance: Normal appearance.  HENT:     Head: Normocephalic and atraumatic.     Nose: No congestion or rhinorrhea.  Eyes:     General:        Right eye: No discharge.        Left eye: No discharge.     Extraocular Movements: Extraocular movements intact.     Pupils: Pupils are equal, round, and reactive to light.  Cardiovascular:     Rate and Rhythm: Normal rate and regular rhythm.     Heart sounds: No murmur heard. Pulmonary:     Effort: No respiratory distress.     Breath  sounds: No wheezing or rales.  Abdominal:     General: There is no distension.     Tenderness:  There is no abdominal tenderness.  Musculoskeletal:        General: Normal range of motion.     Cervical back: Normal range of motion.  Skin:    General: Skin is warm and dry.  Neurological:     General: No focal deficit present.     Mental Status: [name] is alert.     ED Results and Treatments Labs (all labs ordered are listed, but only abnormal results are displayed) Labs Reviewed  CULTURE, BLOOD (ROUTINE X 2)                                                                                                                          Radiology No results found.  Pertinent labs & imaging results that were available during my care of the patient were reviewed by me and considered in my medical decision making (see MDM for details).  Medications Ordered in ED Medications  lactated ringers bolus 1,000 mL (0 mLs Intravenous Stopped 10/09/22 2309)                                                                                                                                     Procedures Procedures  (including critical care time)  Medical Decision Making / ED Course   This patient presents to the ED for concern of syncope, this involves an extensive number of treatment options, and is a complaint that carries with it a high risk of complications and morbidity.  The differential diagnosis includes orthostatic syncope, vasovagal syncope, cardiogenic syncope, electrolyte abnormality, dehydration, sepsis, bacteremia  MDM: Patient seen emergency room for evaluation of syncope.  Physical exam largely unremarkable.  Patient previously had labs done today that were reassuring and we decided not to repeat his labs but we did trial repeat cultures to ensure the patient is properly clearing her previous possible bacteremia.  Patient received 1 L of lactated Ringer's and orthostatic  vital signs showed no return of symptoms and were ultimately negative.  Suspect small amounts of dehydration secondary to decreased p.o. intake and orthostatic syncope from rapid change of position today.  ECG with no evidence of Brugada, WPW or other dysrhythmia.  Patient then discharged with outpatient follow-up and will receive a call if culture is positive.  She was instructed that if possible, it may be more beneficial to present to [facility] if cultures are positive  as this line was placed there and her bariatric surgeons are there.  However, I did state that if she was feeling quite ill her symptoms were worsening, she is more than welcome to come back to this emergency department and we can provide appropriate care.  Patient voiced understanding of this and she was discharged in the care of her significant other.   Additional history obtained: -Additional history obtained from significant other -External records from outside source obtained and reviewed including: Chart review including previous notes, labs, imaging, consultation notes   Lab Tests: -I ordered, reviewed, and interpreted labs.   The pertinent results include:   Labs Reviewed  CULTURE, BLOOD (ROUTINE X 2)      EKG   EKG Interpretation  Date/Time:  Monday October 09 2022 20:26:08 EDT Ventricular Rate:  56 PR Interval:  154 QRS Duration: 90 QT Interval:  439 QTC Calculation: 424 R Axis:   26 Text Interpretation: Sinus rhythm Confirmed by Ytzel Gubler ([id number]) on 10/09/2022 11:08:12 PM          Medicines ordered and prescription drug management: Meds ordered this encounter  Medications   lactated ringers bolus 1,000 mL    -I have reviewed the patients home medicines and have made adjustments as needed  Critical interventions none     Cardiac Monitoring: The patient was maintained on a cardiac monitor.  I personally viewed and interpreted the cardiac monitored which showed an underlying rhythm of:  NSR  Social Determinants of Health:  Factors impacting patients care include: none   Reevaluation: After the interventions noted above, I reevaluated the patient and found that they have :improved  Co morbidities that complicate the patient evaluation  Past Medical History:  Diagnosis Date   Anxiety    Cyclic vomiting syndrome    Depression    Endometriosis    Gastroparesis    GERD (gastroesophageal reflux disease)    Morbid obesity       Dispostion: I considered admission for this patient, but at this time she does not meet inpatient criteria for admission she is safe for discharge with outpatient follow-up     Final Clinical Impression(s) / ED Diagnoses Final diagnoses:  Orthostatic syncope     @PCDICTATION @    Glendora Score, MD 10/10/22 [phone]

## 2022-10-14 LAB — CULTURE, BLOOD (ROUTINE X 2)
Culture: NO GROWTH
Special Requests: ADEQUATE

## 2022-11-30 ENCOUNTER — Inpatient Hospital Stay (HOSPITAL_COMMUNITY)
Admit: 2022-11-30 | Discharge: 2022-11-30 | Disposition: A | Discharge status: Home / Self Care | Payer: Medicaid Other | Attending: Internal Medicine | Admitting: Internal Medicine

## 2022-11-30 ENCOUNTER — Encounter (HOSPITAL_COMMUNITY): Payer: Self-pay | Admitting: Internal Medicine

## 2022-11-30 ENCOUNTER — Inpatient Hospital Stay (HOSPITAL_COMMUNITY)
Admission: EM | Admit: 2022-11-30 | Discharge: 2022-12-03 | DRG: 312 | Disposition: A | Discharge status: Home / Self Care | Payer: Medicaid Other | Attending: Internal Medicine | Admitting: Internal Medicine

## 2022-11-30 ENCOUNTER — Emergency Department (HOSPITAL_COMMUNITY): Payer: Medicaid Other

## 2022-11-30 ENCOUNTER — Other Ambulatory Visit: Payer: Self-pay

## 2022-11-30 DIAGNOSIS — F17290 Nicotine dependence, other tobacco product, uncomplicated: Secondary | ICD-10-CM | POA: Diagnosis present

## 2022-11-30 DIAGNOSIS — Z8249 Family history of ischemic heart disease and other diseases of the circulatory system: Secondary | ICD-10-CM | POA: Diagnosis not present

## 2022-11-30 DIAGNOSIS — E876 Hypokalemia: Secondary | ICD-10-CM | POA: Diagnosis present

## 2022-11-30 DIAGNOSIS — R4182 Altered mental status, unspecified: Secondary | ICD-10-CM | POA: Diagnosis present

## 2022-11-30 DIAGNOSIS — Z818 Family history of other mental and behavioral disorders: Secondary | ICD-10-CM | POA: Diagnosis not present

## 2022-11-30 DIAGNOSIS — K3184 Gastroparesis: Secondary | ICD-10-CM | POA: Diagnosis present

## 2022-11-30 DIAGNOSIS — Z809 Family history of malignant neoplasm, unspecified: Secondary | ICD-10-CM

## 2022-11-30 DIAGNOSIS — Z91013 Allergy to seafood: Secondary | ICD-10-CM

## 2022-11-30 DIAGNOSIS — Z8711 Personal history of peptic ulcer disease: Secondary | ICD-10-CM | POA: Diagnosis not present

## 2022-11-30 DIAGNOSIS — R112 Nausea with vomiting, unspecified: Secondary | ICD-10-CM | POA: Diagnosis not present

## 2022-11-30 DIAGNOSIS — Z79899 Other long term (current) drug therapy: Secondary | ICD-10-CM | POA: Diagnosis not present

## 2022-11-30 DIAGNOSIS — G8929 Other chronic pain: Secondary | ICD-10-CM | POA: Diagnosis present

## 2022-11-30 DIAGNOSIS — R55 Syncope and collapse: Principal | ICD-10-CM | POA: Diagnosis present

## 2022-11-30 DIAGNOSIS — D649 Anemia, unspecified: Secondary | ICD-10-CM | POA: Diagnosis present

## 2022-11-30 DIAGNOSIS — Z6841 Body Mass Index (BMI) 40.0 and over, adult: Secondary | ICD-10-CM

## 2022-11-30 DIAGNOSIS — R569 Unspecified convulsions: Secondary | ICD-10-CM

## 2022-11-30 DIAGNOSIS — R109 Unspecified abdominal pain: Secondary | ICD-10-CM | POA: Diagnosis present

## 2022-11-30 DIAGNOSIS — R531 Weakness: Secondary | ICD-10-CM

## 2022-11-30 DIAGNOSIS — Z91030 Bee allergy status: Secondary | ICD-10-CM | POA: Diagnosis not present

## 2022-11-30 DIAGNOSIS — K219 Gastro-esophageal reflux disease without esophagitis: Secondary | ICD-10-CM | POA: Diagnosis present

## 2022-11-30 LAB — BLOOD GAS, ARTERIAL
Acid-base deficit: 3 mmol/L — ABNORMAL HIGH (ref 0.0–2.0)
Bicarbonate: 21.1 mmol/L (ref 20.0–28.0)
Drawn by: 20012
FIO2: 21 %
O2 Saturation: 97.9 %
Patient temperature: 36.8
pCO2 arterial: 34 mmHg (ref 32–48)
pH, Arterial: 7.4 (ref 7.35–7.45)
pO2, Arterial: 126 mmHg — ABNORMAL HIGH (ref 83–108)

## 2022-11-30 LAB — DIFFERENTIAL
Abs Immature Granulocytes: 0.04 10*3/uL (ref 0.00–0.07)
Basophils Absolute: 0 10*3/uL (ref 0.0–0.1)
Basophils Relative: 0 %
Eosinophils Absolute: 0 10*3/uL (ref 0.0–0.5)
Eosinophils Relative: 0 %
Immature Granulocytes: 0 %
Lymphocytes Relative: 8 %
Lymphs Abs: 0.8 10*3/uL (ref 0.7–4.0)
Monocytes Absolute: 0.5 10*3/uL (ref 0.1–1.0)
Monocytes Relative: 4 %
Neutro Abs: 9.2 10*3/uL — ABNORMAL HIGH (ref 1.7–7.7)
Neutrophils Relative %: 88 %

## 2022-11-30 LAB — I-STAT CHEM 8, ED
BUN: 3 mg/dL — ABNORMAL LOW (ref 6–20)
Calcium, Ion: 1.21 mmol/L (ref 1.15–1.40)
Chloride: 105 mmol/L (ref 98–111)
Creatinine, Ser: 0.6 mg/dL (ref 0.44–1.00)
Glucose, Bld: 113 mg/dL — ABNORMAL HIGH (ref 70–99)
HCT: 42 % (ref 36.0–46.0)
Hemoglobin: 14.3 g/dL (ref 12.0–15.0)
Potassium: 3.6 mmol/L (ref 3.5–5.1)
Sodium: 142 mmol/L (ref 135–145)
TCO2: 23 mmol/L (ref 22–32)

## 2022-11-30 LAB — TYPE AND SCREEN
ABO/RH(D): A POS
Antibody Screen: NEGATIVE

## 2022-11-30 LAB — PROTIME-INR
INR: 1.2 (ref 0.8–1.2)
Prothrombin Time: 15 seconds (ref 11.4–15.2)

## 2022-11-30 LAB — CBC
HCT: 40.2 % (ref 36.0–46.0)
Hemoglobin: 13 g/dL (ref 12.0–15.0)
MCH: 31.5 pg (ref 26.0–34.0)
MCHC: 32.3 g/dL (ref 30.0–36.0)
MCV: 97.3 fL (ref 80.0–100.0)
Platelets: 215 10*3/uL (ref 150–400)
RBC: 4.13 MIL/uL (ref 3.87–5.11)
RDW: 14.2 % (ref 11.5–15.5)
WBC: 10.6 10*3/uL — ABNORMAL HIGH (ref 4.0–10.5)
nRBC: 0 % (ref 0.0–0.2)

## 2022-11-30 LAB — URINALYSIS, ROUTINE W REFLEX MICROSCOPIC
Bilirubin Urine: NEGATIVE
Glucose, UA: NEGATIVE mg/dL
Ketones, ur: 80 mg/dL — AB
Leukocytes,Ua: NEGATIVE
Nitrite: NEGATIVE
Protein, ur: NEGATIVE mg/dL
Specific Gravity, Urine: >1.046 — ABNORMAL HIGH (ref 1.005–1.030)
pH: 5 (ref 5.0–8.0)

## 2022-11-30 LAB — RAPID URINE DRUG SCREEN, HOSP PERFORMED
Amphetamines: NOT DETECTED
Barbiturates: NOT DETECTED
Benzodiazepines: NOT DETECTED
Cocaine: NOT DETECTED
Opiates: NOT DETECTED
Tetrahydrocannabinol: NOT DETECTED

## 2022-11-30 LAB — COMPREHENSIVE METABOLIC PANEL
ALT: 42 U/L (ref 0–44)
AST: 27 U/L (ref 15–41)
Albumin: 3.9 g/dL (ref 3.5–5.0)
Alkaline Phosphatase: 86 U/L (ref 38–126)
Anion gap: 11 (ref 5–15)
BUN: 6 mg/dL (ref 6–20)
CO2: 22 mmol/L (ref 22–32)
Calcium: 9.4 mg/dL (ref 8.9–10.3)
Chloride: 106 mmol/L (ref 98–111)
Creatinine, Ser: 0.67 mg/dL (ref 0.44–1.00)
GFR, Estimated: >60 mL/min (ref >60)
Glucose, Bld: 110 mg/dL — ABNORMAL HIGH (ref 70–99)
Potassium: 3.3 mmol/L — ABNORMAL LOW (ref 3.5–5.1)
Sodium: 139 mmol/L (ref 135–145)
Total Bilirubin: 1.2 mg/dL (ref 0.3–1.2)
Total Protein: 7.4 g/dL (ref 6.5–8.1)

## 2022-11-30 LAB — ETHANOL: Alcohol, Ethyl (B): <10 mg/dL (ref <10)

## 2022-11-30 LAB — HIV ANTIBODY (ROUTINE TESTING W REFLEX): HIV Screen 4th Generation wRfx: NONREACTIVE

## 2022-11-30 LAB — APTT: aPTT: 28 seconds (ref 24–36)

## 2022-11-30 LAB — POC OCCULT BLOOD, ED: Fecal Occult Bld: NEGATIVE

## 2022-11-30 LAB — TROPONIN I (HIGH SENSITIVITY): Troponin I (High Sensitivity): 4 ng/L (ref <18)

## 2022-11-30 LAB — I-STAT BETA HCG BLOOD, ED (MC, WL, AP ONLY): I-stat hCG, quantitative: <5 m[IU]/mL (ref <5)

## 2022-11-30 MED ORDER — ACETAMINOPHEN 325 MG PO TABS
650.0000 mg | ORAL_TABLET | Freq: Four times a day (QID) | ORAL | Status: DC | PRN
  Administered 2022-11-30: 650 mg via ORAL
  Filled 2022-11-30 (×2): qty 2

## 2022-11-30 MED ORDER — KETOROLAC TROMETHAMINE 15 MG/ML IJ SOLN
15.0000 mg | INTRAMUSCULAR | Status: AC
  Administered 2022-11-30: 15 mg via INTRAVENOUS
  Filled 2022-11-30: qty 1

## 2022-11-30 MED ORDER — TRAZODONE HCL 50 MG PO TABS: 25.0000 mg | ORAL_TABLET | Freq: Every evening | ORAL | Status: DC | PRN

## 2022-11-30 MED ORDER — ONDANSETRON HCL 4 MG/2ML IJ SOLN
4.0000 mg | Freq: Four times a day (QID) | INTRAMUSCULAR | Status: DC | PRN
  Administered 2022-12-01 – 2022-12-02 (×2): 4 mg via INTRAVENOUS
  Filled 2022-11-30 (×2): qty 2

## 2022-11-30 MED ORDER — PANTOPRAZOLE SODIUM 40 MG IV SOLR
40.0000 mg | Freq: Two times a day (BID) | INTRAVENOUS | Status: DC
  Administered 2022-11-30 – 2022-12-03 (×7): 40 mg via INTRAVENOUS
  Filled 2022-11-30 (×7): qty 10

## 2022-11-30 MED ORDER — SODIUM CHLORIDE 0.9 % IV SOLN
100.0000 mL/h | INTRAVENOUS | Status: DC
  Administered 2022-11-30 – 2022-12-01 (×3): 100 mL/h via INTRAVENOUS

## 2022-11-30 MED ORDER — ACETAMINOPHEN 650 MG RE SUPP: 650.0000 mg | Freq: Four times a day (QID) | RECTAL | Status: DC | PRN

## 2022-11-30 MED ORDER — METOCLOPRAMIDE HCL 5 MG/ML IJ SOLN
10.0000 mg | Freq: Four times a day (QID) | INTRAMUSCULAR | Status: DC | PRN
  Administered 2022-11-30 (×2): 10 mg via INTRAVENOUS
  Filled 2022-11-30: qty 2

## 2022-11-30 MED ORDER — SODIUM CHLORIDE (PF) 0.9 % IJ SOLN
INTRAMUSCULAR | Status: AC
  Filled 2022-11-30: qty 50

## 2022-11-30 MED ORDER — ONDANSETRON HCL 4 MG/2ML IJ SOLN: 4.0000 mg | Freq: Once | INTRAMUSCULAR | Status: AC

## 2022-11-30 MED ORDER — SODIUM CHLORIDE 0.9 % IV BOLUS
500.0000 mL | Freq: Once | INTRAVENOUS | Status: AC
  Administered 2022-11-30: 500 mL via INTRAVENOUS

## 2022-11-30 MED ORDER — ALBUTEROL SULFATE (2.5 MG/3ML) 0.083% IN NEBU: 2.5000 mg | INHALATION_SOLUTION | RESPIRATORY_TRACT | Status: DC | PRN

## 2022-11-30 MED ORDER — DIPHENHYDRAMINE HCL 50 MG/ML IJ SOLN
12.5000 mg | INTRAMUSCULAR | Status: AC
  Administered 2022-11-30: 12.5 mg via INTRAVENOUS
  Filled 2022-11-30: qty 1

## 2022-11-30 MED ORDER — ONDANSETRON HCL 4 MG/2ML IJ SOLN
INTRAMUSCULAR | Status: AC
  Administered 2022-11-30: 4 mg via INTRAVENOUS
  Filled 2022-11-30: qty 2

## 2022-11-30 MED ORDER — ENOXAPARIN SODIUM 40 MG/0.4ML IJ SOSY: 40.0000 mg | PREFILLED_SYRINGE | INTRAMUSCULAR | Status: DC

## 2022-11-30 MED ORDER — HYDRALAZINE HCL 20 MG/ML IJ SOLN: 5.0000 mg | Freq: Four times a day (QID) | INTRAMUSCULAR | Status: DC | PRN

## 2022-11-30 MED ORDER — METOCLOPRAMIDE HCL 5 MG/ML IJ SOLN
5.0000 mg | INTRAMUSCULAR | Status: AC
  Filled 2022-11-30: qty 2

## 2022-11-30 MED ORDER — IOHEXOL 350 MG/ML SOLN
75.0000 mL | Freq: Once | INTRAVENOUS | Status: AC | PRN
  Administered 2022-11-30: 75 mL via INTRAVENOUS

## 2022-11-30 MED ORDER — ONDANSETRON HCL 4 MG/2ML IJ SOLN
4.0000 mg | Freq: Once | INTRAMUSCULAR | Status: AC
  Administered 2022-11-30: 4 mg via INTRAVENOUS
  Filled 2022-11-30: qty 2

## 2022-11-30 MED ORDER — POLYETHYLENE GLYCOL 3350 17 G PO PACK: 17.0000 g | PACK | Freq: Every day | ORAL | Status: DC | PRN

## 2022-11-30 MED ORDER — ONDANSETRON HCL 4 MG PO TABS: 4.0000 mg | ORAL_TABLET | Freq: Four times a day (QID) | ORAL | Status: DC | PRN

## 2022-11-30 NOTE — ED Notes (Signed)
Attempted to call report again, was told that there is not a nurse currently available to take report. [facility] was notified that [facility] is here to take patient over to [facility].

## 2022-11-30 NOTE — Progress Notes (Signed)
EEG complete - results pending 

## 2022-11-30 NOTE — ED Notes (Addendum)
Attempted to give report, was told by Lamar Laundry, RN to call back again.

## 2022-11-30 NOTE — ED Notes (Signed)
ED TO INPATIENT HANDOFF REPORT  ED Nurse Name and Phone #:   S Name/Age/Gender [name] 22 y.o. adult Room/Bed: WA18/WA18  Code Status   Code Status: Full Code  Home/SNF/Other Home Patient oriented to: self, place, time, and situation Is this baseline? Yes   Triage Complete: Triage complete  Chief Complaint Seizure [facility]) [R56.9]  Triage Note Pt coming from home POV for hematemesis since last night. Pt had a syncopal in the parking lot. Pt states she feels "really weak." Pt currently not able to move arms but can move legs and feet. Pt slurring speech, pt's partner states it is unusual for her.   Allergies Allergies  Allergen Reactions   Shellfish Allergy Anaphylaxis   Bee Pollen Swelling    Pollen General       Level of Care/Admitting Diagnosis ED Disposition     ED Disposition  Admit   Condition  --   Comment  Hospital Area: [facility] [[id number]]  Level of Care: Telemetry Medical [104]  May admit patient to [facility] or [facility] if equivalent level of care is available:: No  Covid Evaluation: Asymptomatic - no recent exposure (last 10 days) testing not required  Diagnosis: Seizure [facility]) [[id number]]  Admitting Physician: Maryln Gottron [[id number]]  Attending Physician: Olexa.Dam, MIR Jaxson.Roy [[id number]]  Certification:: I certify this patient will need inpatient services for at least 2 midnights  Estimated Length of Stay: 3          B Medical/Surgery History Past Medical History:  Diagnosis Date   Anxiety    Cyclic vomiting syndrome    Depression    Endometriosis    Gastroparesis    GERD (gastroesophageal reflux disease)    Morbid obesity (HCC)    Past Surgical History:  Procedure Laterality Date   CHOLECYSTECTOMY       A IV Location/Drains/Wounds Patient Lines/Drains/Airways Status     Active Line/Drains/Airways     Name Placement date Placement time Site Days   Peripheral IV 11/30/22 20 G 1" Right Antecubital  11/30/22  0950  Antecubital  less than 1            Intake/Output Last 24 hours  Intake/Output Summary (Last 24 hours) at 11/30/2022 1914 Last data filed at 11/30/2022 1158 Gross per 24 hour  Intake 500 ml  Output --  Net 500 ml    Labs/Imaging Results for orders placed or performed during the hospital encounter of 11/30/22 (from the past 48 hour(s))  Ethanol     Status: None   Collection Time: 11/30/22  9:44 AM  Result Value Ref Range   Alcohol, Ethyl (B) <10 <10 mg/dL    Comment: (NOTE) Lowest detectable limit for serum alcohol is 10 mg/dL.  For medical purposes only. Performed at [facility], [address] [place], [place] [address]   CBC     Status: Abnormal   Collection Time: 11/30/22  9:44 AM  Result Value Ref Range   WBC 10.6 (H) 4.0 - 10.5 K/uL   RBC 4.13 3.87 - 5.11 MIL/uL   Hemoglobin 13.0 12.0 - 15.0 g/dL   HCT 82.9 56.2 - 13.0 %   MCV 97.3 80.0 - 100.0 fL   MCH 31.5 26.0 - 34.0 pg   MCHC 32.3 30.0 - 36.0 g/dL   RDW 86.5 78.4 - 69.6 %   Platelets 215 150 - 400 K/uL   nRBC 0.0 0.0 - 0.2 %    Comment: Performed at [facility], [address] [place], [place] [address]  Differential     Status: Abnormal   Collection Time: 11/30/22  9:44 AM  Result Value Ref Range   Neutrophils Relative % 88 %   Neutro Abs 9.2 (H) 1.7 - 7.7 K/uL   Lymphocytes Relative 8 %   Lymphs Abs 0.8 0.7 - 4.0 K/uL   Monocytes Relative 4 %   Monocytes Absolute 0.5 0.1 - 1.0 K/uL   Eosinophils Relative 0 %   Eosinophils Absolute 0.0 0.0 - 0.5 K/uL   Basophils Relative 0 %   Basophils Absolute 0.0 0.0 - 0.1 K/uL   Immature Granulocytes 0 %   Abs Immature Granulocytes 0.04 0.00 - 0.07 K/uL    Comment: Performed at [facility], [address] [place], [place] [address]  Comprehensive metabolic panel     Status: Abnormal   Collection Time: 11/30/22  9:44 AM  Result Value Ref Range   Sodium 139 135 - 145 mmol/L    Potassium 3.3 (L) 3.5 - 5.1 mmol/L   Chloride 106 98 - 111 mmol/L   CO2 22 22 - 32 mmol/L   Glucose, Bld 110 (H) 70 - 99 mg/dL    Comment: Glucose reference range applies only to samples taken after fasting for at least 8 hours.   BUN 6 6 - 20 mg/dL   Creatinine, Ser 2.95 0.44 - 1.00 mg/dL   Calcium 9.4 8.9 - 62.1 mg/dL   Total Protein 7.4 6.5 - 8.1 g/dL   Albumin 3.9 3.5 - 5.0 g/dL   AST 27 15 - 41 U/L   ALT 42 0 - 44 U/L   Alkaline Phosphatase 86 38 - 126 U/L   Total Bilirubin 1.2 0.3 - 1.2 mg/dL   GFR, Estimated >30 >86 mL/min    Comment: (NOTE) Calculated using the CKD-EPI Creatinine Equation (2021)    Anion gap 11 5 - 15    Comment: Performed at [facility], [address], [place], [place] [address]  Protime-INR     Status: None   Collection Time: 11/30/22  9:44 AM  Result Value Ref Range   Prothrombin Time 15.0 11.4 - 15.2 seconds   INR 1.2 0.8 - 1.2    Comment: (NOTE) INR goal varies based on device and disease states. Performed at [facility], [address] [place], [place] [address]   APTT     Status: None   Collection Time: 11/30/22  9:44 AM  Result Value Ref Range   aPTT 28 24 - 36 seconds    Comment: Performed at [facility], [address], [place], [place] [address]  Troponin I (High Sensitivity)     Status: None   Collection Time: 11/30/22  9:44 AM  Result Value Ref Range   Troponin I (High Sensitivity) 4 <18 ng/L    Comment: (NOTE) Elevated high sensitivity troponin I (hsTnI) values and significant  changes across serial measurements may suggest ACS but many other  chronic and acute conditions are known to elevate hsTnI results.  Refer to the "Links" section for chest pain algorithms and additional  guidance. Performed at [facility], [address], [place], [place] [address]   Type and screen [facility]     Status: None   Collection Time: 11/30/22   9:52 AM  Result Value Ref Range   ABO/RH(D) A POS    Antibody Screen NEG    Sample Expiration  12/03/2022,2359 Performed at [facility], [address], [place], [place] [address]   I-Stat beta hCG blood, ED     Status: None   Collection Time: 11/30/22  9:55 AM  Result Value Ref Range   I-stat hCG, quantitative <5.0 <5 mIU/mL   Comment 3            Comment:   GEST. AGE      CONC.  (mIU/mL)   <=1 WEEK        5 - 50     2 WEEKS       50 - 500     3 WEEKS       100 - 10,000     4 WEEKS     1,000 - 30,000        FEMALE AND NON-PREGNANT FEMALE:     LESS THAN 5 mIU/mL   I-stat chem 8, ED     Status: Abnormal   Collection Time: 11/30/22  9:56 AM  Result Value Ref Range   Sodium 142 135 - 145 mmol/L   Potassium 3.6 3.5 - 5.1 mmol/L   Chloride 105 98 - 111 mmol/L   BUN 3 (L) 6 - 20 mg/dL   Creatinine, Ser 6.04 0.44 - 1.00 mg/dL   Glucose, Bld 540 (H) 70 - 99 mg/dL    Comment: Glucose reference range applies only to samples taken after fasting for at least 8 hours.   Calcium, Ion 1.21 1.15 - 1.40 mmol/L   TCO2 23 22 - 32 mmol/L   Hemoglobin 14.3 12.0 - 15.0 g/dL   HCT 98.1 19.1 - 47.8 %  POC occult blood, ED Provider will collect     Status: None   Collection Time: 11/30/22  9:58 AM  Result Value Ref Range   Fecal Occult Bld NEGATIVE NEGATIVE  Blood gas, arterial (at [facility])     Status: Abnormal   Collection Time: 11/30/22 10:13 AM  Result Value Ref Range   FIO2 21 %   O2 Content ROOM AIR L/min   pH, Arterial 7.4 7.35 - 7.45   pCO2 arterial 34 32 - 48 mmHg   pO2, Arterial 126 (H) 83 - 108 mmHg   Bicarbonate 21.1 20.0 - 28.0 mmol/L   Acid-base deficit 3.0 (H) 0.0 - 2.0 mmol/L   O2 Saturation 97.9 %   Patient temperature 36.8    Collection site RIGHT RADIAL    Drawn by [id number]    Allens test (pass/fail) PASS PASS    Comment: Performed at [facility], [address], [place], [place] [address]  HIV Antibody (routine testing w rflx)      Status: None   Collection Time: 11/30/22  1:00 PM  Result Value Ref Range   HIV Screen 4th Generation wRfx Non Reactive Non Reactive    Comment: Performed at [facility], [address], [place], [place] [address]  Urine rapid drug screen (hosp performed)     Status: None   Collection Time: 11/30/22  1:28 PM  Result Value Ref Range   Opiates NONE DETECTED NONE DETECTED   Cocaine NONE DETECTED NONE DETECTED   Benzodiazepines NONE DETECTED NONE DETECTED   Amphetamines NONE DETECTED NONE DETECTED   Tetrahydrocannabinol NONE DETECTED NONE DETECTED   Barbiturates NONE DETECTED NONE DETECTED    Comment: (NOTE) DRUG SCREEN FOR MEDICAL PURPOSES ONLY.  IF CONFIRMATION IS NEEDED FOR ANY PURPOSE, NOTIFY LAB WITHIN 5 DAYS.  LOWEST DETECTABLE LIMITS FOR URINE  DRUG SCREEN Drug Class                     Cutoff (ng/mL) Amphetamine and metabolites    1000 Barbiturate and metabolites    200 Benzodiazepine                 200 Opiates and metabolites        300 Cocaine and metabolites        300 THC                            50 Performed at [facility], [address], [place], [place] [address]   Urinalysis, Routine w reflex microscopic -Urine, Clean Catch     Status: Abnormal   Collection Time: 11/30/22  1:28 PM  Result Value Ref Range   Color, Urine YELLOW YELLOW   APPearance CLEAR CLEAR   Specific Gravity, Urine >1.046 (H) 1.005 - 1.030   pH 5.0 5.0 - 8.0   Glucose, UA NEGATIVE NEGATIVE mg/dL   Hgb urine dipstick LARGE (A) NEGATIVE   Bilirubin Urine NEGATIVE NEGATIVE   Ketones, ur 80 (A) NEGATIVE mg/dL   Protein, ur NEGATIVE NEGATIVE mg/dL   Nitrite NEGATIVE NEGATIVE   Leukocytes,Ua NEGATIVE NEGATIVE   RBC / HPF 11-20 0 - 5 RBC/hpf   WBC, UA 0-5 0 - 5 WBC/hpf   Bacteria, UA RARE (A) NONE SEEN   Squamous Epithelial / HPF 6-10 0 - 5 /HPF   Mucus PRESENT     Comment: Performed at [facility], [address], [place], [place] [address]    CT C-SPINE NO CHARGE  Result Date: 11/30/2022 CLINICAL DATA:  Neck trauma. EXAM: CT Cervical Spine with contrast TECHNIQUE: Multiplanar CT images of the cervical spine were reconstructed from contemporary CT of the Neck. RADIATION DOSE REDUCTION: This exam was performed according to the departmental dose-optimization program which includes automated exposure control, adjustment of the mA and/or kV according to patient size and/or use of iterative reconstruction technique. CONTRAST:  None additional COMPARISON:  None Available. FINDINGS: Alignment: Normal. Skull base and vertebrae: No acute fracture. No primary bone lesion or focal pathologic process. Soft tissues and spinal canal: No prevertebral fluid or swelling. No visible canal hematoma. Disc levels:  No visible impingement Upper chest: Negative IMPRESSION: Negative neck CT. Electronically Signed   By: Tiburcio Pea M.D.   On: 11/30/2022 11:35   CT ANGIO HEAD NECK W WO CM  Result Date: 11/30/2022 CLINICAL DATA:  Syncope with weakness.  Slurred speech EXAM: CT ANGIOGRAPHY HEAD AND NECK WITH AND WITHOUT CONTRAST TECHNIQUE: Multidetector CT imaging of the head and neck was performed using the standard protocol during bolus administration of intravenous contrast. Multiplanar CT image reconstructions and MIPs were obtained to evaluate the vascular anatomy. Carotid stenosis measurements (when applicable) are obtained utilizing NASCET criteria, using the distal internal carotid diameter as the denominator. RADIATION DOSE REDUCTION: This exam was performed according to the departmental dose-optimization program which includes automated exposure control, adjustment of the mA and/or kV according to patient size and/or use of iterative reconstruction technique. CONTRAST:  75mL OMNIPAQUE IOHEXOL 350 MG/ML SOLN COMPARISON:  None Available. FINDINGS: CT HEAD FINDINGS Brain: Suboptimal gray-white differentiation due to low-dose technique. No hemorrhage or detected  infarct. No hydrocephalus or masslike finding Vascular: See below Skull: Unremarkable Sinuses/Orbits: Unremarkable Review of the MIP images confirms the above findings CTA NECK FINDINGS Aortic arch: Normal  with 2 vessel branching Right carotid system: Vessels are smoothly contoured and widely patent Left carotid system: Vessels are smoothly contoured and widely patent Vertebral arteries: No proximal subclavian stenosis. The vertebral arteries are smoothly contoured and widely patent. Skeleton: Negative Other neck: Negative Upper chest: Negative. Review of the MIP images confirms the above findings CTA HEAD FINDINGS Anterior circulation: No significant stenosis, proximal occlusion, aneurysm, or vascular malformation. Mild irregularity of vessel lumen thick MIPS is likely technical. No segmental beading. Posterior circulation: No significant stenosis, proximal occlusion, aneurysm, or vascular malformation. Venous sinuses: As permitted by contrast timing, patent. Anatomic variants: None unusual Review of the MIP images confirms the above findings IMPRESSION: Negative CTA.  No explanation for symptoms. Electronically Signed   By: Tiburcio Pea M.D.   On: 11/30/2022 11:34   DG Chest Portable 1 View  Result Date: 11/30/2022 CLINICAL DATA:  Weakness EXAM: PORTABLE CHEST 1 VIEW COMPARISON:  X-ray 07/05/2022 FINDINGS: No consolidation, pneumothorax or effusion. No edema. Normal cardiopericardial silhouette. Overlapping cardiac leads. Film is under penetrated IMPRESSION: No acute cardiopulmonary disease. Electronically Signed   By: Karen Kays M.D.   On: 11/30/2022 10:26    Pending Labs Unresulted Labs (From admission, onward)     Start     Ordered   12/01/22 0500  Basic metabolic panel  Tomorrow morning,   R        11/30/22 1301   12/01/22 0500  CBC  Tomorrow morning,   R        11/30/22 1301   11/30/22 1256  ABO/Rh  Once,   R        11/30/22 1256            Vitals/Pain Today's Vitals   11/30/22  1130 11/30/22 1421 11/30/22 1653 11/30/22 1745  BP: (!) 172/93  (!) 143/116 133/68  Pulse: (!) 50  65 62  Resp: 19 (!) 22 18 16   Temp:  98.6 F (37 C) 98.9 F (37.2 C)   TempSrc:  Oral    SpO2: 97%  100% 99%  PainSc:        Isolation Precautions No active isolations  Medications Medications  sodium chloride 0.9 % bolus 500 mL (0 mLs Intravenous Stopped 11/30/22 1158)    Followed by  0.9 %  sodium chloride infusion (100 mL/hr Intravenous New Bag/Given 11/30/22 1047)  acetaminophen (TYLENOL) tablet 650 mg (has no administration in time range)    Or  acetaminophen (TYLENOL) suppository 650 mg (has no administration in time range)  traZODone (DESYREL) tablet 25 mg (has no administration in time range)  polyethylene glycol (MIRALAX / GLYCOLAX) packet 17 g (has no administration in time range)  ondansetron (ZOFRAN) tablet 4 mg (has no administration in time range)    Or  ondansetron (ZOFRAN) injection 4 mg (has no administration in time range)  albuterol (PROVENTIL) (2.5 MG/3ML) 0.083% nebulizer solution 2.5 mg (has no administration in time range)  hydrALAZINE (APRESOLINE) injection 5 mg (has no administration in time range)  pantoprazole (PROTONIX) injection 40 mg (40 mg Intravenous Given 11/30/22 1408)  metoCLOPramide (REGLAN) injection 10 mg (10 mg Intravenous Given 11/30/22 1409)  ondansetron (ZOFRAN) injection 4 mg (4 mg Intravenous Given 11/30/22 0952)  iohexol (OMNIPAQUE) 350 MG/ML injection 75 mL (75 mLs Intravenous Contrast Given 11/30/22 1025)  ondansetron (ZOFRAN) injection 4 mg (4 mg Intravenous Given 11/30/22 1101)    Mobility walks   At baseline  Focused Assessments Neuro Assessment Handoff:  Swallow screen pass? No  NIH Stroke Scale  Dizziness Present: Yes Headache Present: Yes Interval: Other (Comment) Level of Consciousness (1a.)   : Alert, keenly responsive LOC Questions (1b. )   : Answers both questions correctly LOC Commands (1c. )   : Performs both tasks  correctly Best Gaze (2. )  : Normal Visual (3. )  : No visual loss Facial Palsy (4. )    : Normal symmetrical movements Motor Arm, Left (5a. )   : No drift Motor Arm, Right (5b. ) : No drift Motor Leg, Left (6a. )  : No drift Motor Leg, Right (6b. ) : No drift Limb Ataxia (7. ): Absent Sensory (8. )  : Normal, no sensory loss Best Language (9. )  : Mild-to-moderate aphasia Dysarthria (10. ): Mild-to-moderate dysarthria, patient slurs at least some words and, at worst, can be understood with some difficulty Extinction/Inattention (11.)   : No Abnormality Complete NIHSS TOTAL: 2 Last date known well: 11/29/22 Last time known well: 1159 Neuro Assessment:   Neuro Checks:   Initial (11/30/22 1010)  Has TPA been given? No If patient is a Neuro Trauma and patient is going to OR before floor call report to 4N Charge nurse: [phone] or [phone]   R Recommendations: See Admitting Provider Note  Report given to:   Additional Notes:

## 2022-11-30 NOTE — Procedures (Signed)
Patient Name: Glenda Kim  MRN: [id number]  Epilepsy Attending: Charlsie Quest  Referring Physician/Provider: Maryln Gottron, MD  Date: 11/30/2022 Duration: 23.18 mins  Patient history: 22yo F with syncope getting eeg to evaluate for seizure  Level of alertness: Awake, asleep  AEDs during EEG study:   Technical aspects: This EEG study was done with scalp electrodes positioned according to the 10-20 International system of electrode placement. Electrical activity was reviewed with band pass filter of 1-70Hz , sensitivity of 7 uV/mm, display speed of 20mm/sec with a 60Hz  notched filter applied as appropriate. EEG data were recorded continuously and digitally stored.  Video monitoring was available and reviewed as appropriate.  Description: The posterior dominant rhythm consists of 9 Hz activity of moderate voltage (25-35 uV) seen predominantly in posterior head regions, symmetric and reactive to eye opening and eye closing. Sleep was characterized by vertex waves, sleep spindles (12 to 14 Hz), maximal frontocentral region. Hyperventilation and photic stimulation were not performed.     IMPRESSION: This study is within normal limits. No seizures or epileptiform discharges were seen throughout the recording.  A normal interictal EEG does not exclude the diagnosis of epilepsy.  Joshu Furukawa Annabelle Harman

## 2022-11-30 NOTE — H&P (Signed)
History and Physical  Glenda Kim ZOX:[id number] DOB: 2000-08-10 DOA: 11/30/2022  PCP: Arnette Felts, FNP   Chief Complaint: n/v, syncope   HPI: Glenda Kim is a 22 y.o. 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  History is provided by the patient, as well as her partner at the bedside.  She has chronic issues with nausea vomiting abdominal pain, exacerbated after her surgery.  Apparently the patient had multiple rounds of emesis yesterday, started to see some coffee grounds and bright red specks of blood in it.  They were coming here for evaluation in the ER, when the patient got out of the car and passed out.  Partner denies any seizure-like activity but the patient was unresponsive for some time.  Staff from the ER had to go and transported her to the ED via stretcher.  Patient never lost her pulse, initially was not answering questions but eventually came around, after initially stating that she could not move lower part of her body.  Now she neurologically feels back to normal but per partner is much sleepier than usual, patient is currently still nauseous and spitting up blood though it is not currently bloody.  Patient tells me that she does have a prior history of peptic ulcer disease and upper GI bleeding.  ED Course: Upon evaluation in the ER, patient's blood pressure has been elevated as high as 182/107, pulse is borderline low about 50 but otherwise vital signs are unremarkable.  Lab work was also done and relatively unremarkable.  Fecal occult blood test was done, and negative.  Patient had CT angiogram of the head and neck without acute abnormality.  ER provider discussed with neurology, who recommended hospitalist admission to [facility] for formal neurology evaluation and  workup.  Review of Systems: Please see HPI for pertinent positives and negatives. A complete 10 system review of systems are otherwise negative.  Past Medical History:  Diagnosis Date   Anxiety    Cyclic vomiting syndrome    Depression    Endometriosis    Gastroparesis    GERD (gastroesophageal reflux disease)    Morbid obesity (HCC)    Past Surgical History:  Procedure Laterality Date   CHOLECYSTECTOMY      Social History:  reports that Glenda Service "[name]" has been smoking e-cigarettes. Glenda Service "[name]" has never used smokeless tobacco. Glenda Service "[name]" reports current alcohol use. Glenda Service "[place]" reports that Glenda Service "[name]" does not currently use drugs.   Allergies  Allergen Reactions   Shellfish Allergy Anaphylaxis   Bee Pollen Swelling    Pollen General       Family History  Problem Relation Age of Onset   Depression Mother    Anxiety disorder Mother    Heart disease Father    Cancer Father    Healthy Sister    Depression Brother    Anxiety disorder Brother    Irritable bowel syndrome Maternal Grandmother    Crohn's disease Maternal Grandmother    Cancer Maternal Grandfather    Hypertension Paternal Grandmother      Prior to Admission medications   Medication Sig Start Date End Date Taking? Authorizing Provider  acetaZOLAMIDE ER (DIAMOX) 500 MG capsule Take 1 capsule (500 mg total) by mouth 2 (two) times daily. Patient not taking: Reported on 11/30/2022 02/28/22   Ihor Austin, NP  doxylamine, Sleep, [place])  25 MG tablet Take 25 mg by mouth at bedtime. 06/27/22 08/10/22  [provider]  promethazine (PHENERGAN) 25 MG tablet Take 1 tablet (25 mg total) by mouth every 8 (eight) hours as needed for nausea or vomiting. Patient not taking: Reported on 11/30/2022 08/02/22   Tilden Fossa, MD    Physical Exam: BP (!) 172/93   Pulse (!) 50   Temp 98.2 F (36.8 C) (Oral)   Resp 19   LMP  (LMP Unknown)   SpO2 97%   General:   Alert but a little sleepy appearing initially, actively vomiting, oriented, calm, in no acute distress, her partner is at the bedside Eyes: EOMI, clear conjuctivae, white sclerea Neck: supple, no masses, trachea mildline  Cardiovascular: RRR, no murmurs or rubs, no peripheral edema  Respiratory: clear to auscultation bilaterally, no wheezes, no crackles  Abdomen: soft, nontender, nondistended, normal bowel tones heard  Skin: dry, no rashes  Musculoskeletal: no joint effusions, normal range of motion  Psychiatric: appropriate affect, normal speech  Neurologic: extraocular muscles intact, clear speech, moving all extremities with intact sensorium          Labs on Admission:  Basic Metabolic Panel: Recent Labs  Lab 11/30/22 0944 11/30/22 0956  NA 139 142  K 3.3* 3.6  CL 106 105  CO2 22  --   GLUCOSE 110* 113*  BUN 6 3*  CREATININE 0.67 0.60  CALCIUM 9.4  --    Liver Function Tests: Recent Labs  Lab 11/30/22 0944  AST 27  ALT 42  ALKPHOS 86  BILITOT 1.2  PROT 7.4  ALBUMIN 3.9   No results for input(s): "LIPASE", "AMYLASE" in the last 168 hours. No results for input(s): "AMMONIA" in the last 168 hours. CBC: Recent Labs  Lab 11/30/22 0944 11/30/22 0956  WBC 10.6*  --   NEUTROABS 9.2*  --   HGB 13.0 14.3  HCT 40.2 42.0  MCV 97.3  --   PLT 215  --    Cardiac Enzymes: No results for input(s): "CKTOTAL", "CKMB", "CKMBINDEX", "TROPONINI" in the last 168 hours.  BNP (last 3 results) No results for input(s): "BNP" in the last 8760 hours.  ProBNP (last 3 results) No results for input(s): "PROBNP" in the last 8760 hours.  CBG: No results for input(s): "GLUCAP" in the last 168 hours.  Radiological Exams on Admission: CT C-SPINE NO CHARGE  Result Date: 11/30/2022 CLINICAL DATA:  Neck trauma. EXAM: CT Cervical Spine with contrast TECHNIQUE: Multiplanar CT images of the cervical spine were reconstructed from contemporary CT of the Neck. RADIATION DOSE REDUCTION: This  exam was performed according to the departmental dose-optimization program which includes automated exposure control, adjustment of the mA and/or kV according to patient size and/or use of iterative reconstruction technique. CONTRAST:  None additional COMPARISON:  None Available. FINDINGS: Alignment: Normal. Skull base and vertebrae: No acute fracture. No primary bone lesion or focal pathologic process. Soft tissues and spinal canal: No prevertebral fluid or swelling. No visible canal hematoma. Disc levels:  No visible impingement Upper chest: Negative IMPRESSION: Negative neck CT. Electronically Signed   By: Tiburcio Pea M.D.   On: 11/30/2022 11:35   CT ANGIO HEAD NECK W WO CM  Result Date: 11/30/2022 CLINICAL DATA:  Syncope with weakness.  Slurred speech EXAM: CT ANGIOGRAPHY HEAD AND NECK WITH AND WITHOUT CONTRAST TECHNIQUE: Multidetector CT imaging of the head and neck was performed using the standard protocol during bolus administration of intravenous contrast. Multiplanar CT image reconstructions and MIPs  were obtained to evaluate the vascular anatomy. Carotid stenosis measurements (when applicable) are obtained utilizing NASCET criteria, using the distal internal carotid diameter as the denominator. RADIATION DOSE REDUCTION: This exam was performed according to the departmental dose-optimization program which includes automated exposure control, adjustment of the mA and/or kV according to patient size and/or use of iterative reconstruction technique. CONTRAST:  75mL OMNIPAQUE IOHEXOL 350 MG/ML SOLN COMPARISON:  None Available. FINDINGS: CT HEAD FINDINGS Brain: Suboptimal gray-white differentiation due to low-dose technique. No hemorrhage or detected infarct. No hydrocephalus or masslike finding Vascular: See below Skull: Unremarkable Sinuses/Orbits: Unremarkable Review of the MIP images confirms the above findings CTA NECK FINDINGS Aortic arch: Normal with 2 vessel branching Right carotid system: Vessels  are smoothly contoured and widely patent Left carotid system: Vessels are smoothly contoured and widely patent Vertebral arteries: No proximal subclavian stenosis. The vertebral arteries are smoothly contoured and widely patent. Skeleton: Negative Other neck: Negative Upper chest: Negative. Review of the MIP images confirms the above findings CTA HEAD FINDINGS Anterior circulation: No significant stenosis, proximal occlusion, aneurysm, or vascular malformation. Mild irregularity of vessel lumen thick MIPS is likely technical. No segmental beading. Posterior circulation: No significant stenosis, proximal occlusion, aneurysm, or vascular malformation. Venous sinuses: As permitted by contrast timing, patent. Anatomic variants: None unusual Review of the MIP images confirms the above findings IMPRESSION: Negative CTA.  No explanation for symptoms. Electronically Signed   By: Tiburcio Pea M.D.   On: 11/30/2022 11:34   DG Chest Portable 1 View  Result Date: 11/30/2022 CLINICAL DATA:  Weakness EXAM: PORTABLE CHEST 1 VIEW COMPARISON:  X-ray 07/05/2022 FINDINGS: No consolidation, pneumothorax or effusion. No edema. Normal cardiopericardial silhouette. Overlapping cardiac leads. Film is under penetrated IMPRESSION: No acute cardiopulmonary disease. Electronically Signed   By: Karen Kays M.D.   On: 11/30/2022 10:26    Assessment/Plan This is a 22 year old female with a history of gastroparesis, cyclic vomiting syndrome, morbid obesity, duodenal switch surgery 05/2022 being admitted to the hospital with syncopal episode, hematemesis.  Syncopal episodes-suspected seizure, given sudden onset, and postevent confusion/weakness.  ER provider discussed with neurology on-call, who recommends seizure workup and admission to [facility]. -Inpatient admission to [facility] telemetry unit -Continue to monitor telemetry -EEG -Would notify on-call neurology upon arrival at [facility] line infection-patient  postoperatively had PICC line for TPN, this was removed in April due to concern for bacteremia.  Apparently outpatient blood cultures at a different facility grew Staph epidermidis, and MRSA.  Blood cultures at this facility drawn on 4/15 have been negative.  Per notes, patient has been on linezolid.  Will wait for medication reconciliation, and continue this medication for the time being.  Notably, patient is afebrile, with normal white blood cell count, no other signs or symptoms of active infection  Nausea/vomiting-this is a chronic problem for the patient, she denies abdominal pain, has been having normal bowel movements for her.  She does have a history of gastroparesis and cyclic vomiting.  Reports of some specks of blood, could be from Mallory-Weiss tears due to recent vomiting.  Note stable hemoglobin. -As needed antiemetic -IV PPI -Keep on clear liquids for now -Consider GI evaluation in case of continued intractable nausea/vomiting, or continued/worsening obvious GI bleed  DVT prophylaxis: SCDs, avoid chemical prophylaxis given concern for hematemesis    Code Status: Full Code  Consults called: None  Admission status: The appropriate patient status for this patient is INPATIENT. Inpatient status is judged to be reasonable  and necessary in order to provide the required intensity of service to ensure the patient's safety. The patient's presenting symptoms, physical exam findings, and initial radiographic and laboratory data in the context of their chronic comorbidities is felt to place them at high risk for further clinical deterioration. Furthermore, it is not anticipated that the patient will be medically stable for discharge from the hospital within 2 midnights of admission.    I certify that at the point of admission it is my clinical judgment that the patient will require inpatient hospital care spanning beyond 2 midnights from the point of admission due to high intensity of service,  high risk for further deterioration and high frequency of surveillance required  Time spent: 61 minutes  Shamara Soza Sharlette Dense MD [facility] Pager [phone]  If 7PM-7AM, please contact night-coverage [email or web address] Password [email or web address]  11/30/2022, 1:01 PM

## 2022-11-30 NOTE — Progress Notes (Signed)
Notified NP on call for patient's request for tramadol for her headache, says tylenol usually doesn't help. Also request for order for phenergan as she says zofran does not help with her nausea. No new orders at this time.  Per NP, tramadol increases seizure threshold so it is contraindicated at this time.

## 2022-11-30 NOTE — ED Notes (Signed)
[facility] called. 

## 2022-11-30 NOTE — ED Triage Notes (Signed)
Pt coming from home POV for hematemesis since last night. Pt had a syncopal in the parking lot. Pt states she feels "really weak." Pt currently not able to move arms but can move legs and feet. Pt slurring speech, pt's partner states it is unusual for her.

## 2022-11-30 NOTE — ED Provider Notes (Signed)
[place] EMERGENCY DEPARTMENT AT [facility] Provider Note   CSN: [id number] Arrival date & time: 11/30/22  6578     History  Chief complaint syncope: Altered mental status  Glenda Kim is a 22 y.o. adult.  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  Patient presents to the ED for evaluation after a syncopal episode in the parking lot.  Patient has been having issues with nausea vomiting abdominal pain.  Pr her partner patient started vomiting up blood last evening.  They were driving here to be evaluated.  As she got out of the car she passed out.  No seizure activity was witnessed but the patient was initially unresponsive.  Staff from the emergency room had to go out and transported her to the ED via stretcher.  Patient did have pulses.  She was not answering questions.  My exam patient is only able to answer with a few words at a time.  She states she is scared she feels weak.  She states she cannot feel her body  Home Medications Prior to Admission medications   Medication Sig Start Date End Date Taking? Authorizing Provider  acetaZOLAMIDE ER (DIAMOX) 500 MG capsule Take 1 capsule (500 mg total) by mouth 2 (two) times daily. Patient not taking: Reported on 11/30/2022 02/28/22   Ihor Austin, NP  doxylamine, Sleep, (UNISOM) 25 MG tablet Take 25 mg by mouth at bedtime. 06/27/22 08/10/22  [provider]  promethazine (PHENERGAN) 25 MG tablet Take 1 tablet (25 mg total) by mouth every 8 (eight) hours as needed for nausea or vomiting. Patient not taking: Reported on 11/30/2022 08/02/22   Tilden Fossa, MD      Allergies    Shellfish allergy and Bee pollen    Review of Systems   Review of Systems  Physical Exam Updated Vital Signs BP 134/63 (BP Location: Left Arm)   Pulse 80   Temp 98.2 F  (36.8 C) (Oral)   Resp 17   LMP  (LMP Unknown)   SpO2 98%  Physical Exam Vitals and nursing note reviewed.  Constitutional:      General: Glenda Service "[name]" is in acute distress.     Appearance: Glenda Service "[name]" is well-developed. Glenda Service "[name]" is obese. Glenda Service "[name]" is ill-appearing.  HENT:     Head: Normocephalic and atraumatic.     Right Ear: External ear normal.     Left Ear: External ear normal.  Eyes:     General: No scleral icterus.       Right eye: No discharge.        Left eye: No discharge.     Conjunctiva/sclera: Conjunctivae normal.  Neck:     Trachea: No tracheal deviation.  Cardiovascular:     Rate and Rhythm: Normal rate and regular rhythm.  Pulmonary:     Effort: Pulmonary effort is normal. No respiratory distress.     Breath sounds: Normal breath sounds. No stridor. No wheezing or rales.  Abdominal:     General: Bowel sounds are normal. There is no distension.     Palpations: Abdomen is soft.     Tenderness: There is no abdominal tenderness. There is no guarding or rebound.  Musculoskeletal:        General: No tenderness or deformity.     Cervical back: Neck supple.  Skin:  General: Skin is warm and dry.     Findings: No rash.  Neurological:     General: No focal deficit present.     Mental Status: Glenda Service "[name]" is alert.     GCS: GCS eye subscore is 4. GCS verbal subscore is 5. GCS motor subscore is 6.     Cranial Nerves: No cranial nerve deficit, dysarthria or facial asymmetry.     Sensory: Sensory deficit present.     Motor: No abnormal muscle tone or seizure activity.     Coordination: Coordination normal.     Comments: Patient is able to wiggle her fingers slightly, when asked to wiggle her toes she states she is doing so but he is not able to move them, Patient states she feel numb all over  Psychiatric:        Mood and Affect: Mood normal.     ED Results / Procedures / Treatments   Labs (all labs  ordered are listed, but only abnormal results are displayed) Labs Reviewed  CBC - Abnormal; Notable for the following components:      Result Value   WBC 10.6 (*)    All other components within normal limits  DIFFERENTIAL - Abnormal; Notable for the following components:   Neutro Abs 9.2 (*)    All other components within normal limits  COMPREHENSIVE METABOLIC PANEL - Abnormal; Notable for the following components:   Potassium 3.3 (*)    Glucose, Bld 110 (*)    All other components within normal limits  BLOOD GAS, ARTERIAL - Abnormal; Notable for the following components:   pO2, Arterial 126 (*)    Acid-base deficit 3.0 (*)    All other components within normal limits  I-STAT CHEM 8, ED - Abnormal; Notable for the following components:   BUN 3 (*)    Glucose, Bld 113 (*)    All other components within normal limits  ETHANOL  PROTIME-INR  APTT  RAPID URINE DRUG SCREEN, HOSP PERFORMED  URINALYSIS, ROUTINE W REFLEX MICROSCOPIC  I-STAT BETA HCG BLOOD, ED ([facility], [facility], [facility] ONLY)  POC OCCULT BLOOD, ED  TYPE AND SCREEN  TROPONIN I (HIGH SENSITIVITY)    EKG EKG Interpretation  Date/Time:  Thursday November 30 2022 11:05:16 EDT Ventricular Rate:  58 PR Interval:  151 QRS Duration: 82 QT Interval:  414 QTC Calculation: 407 R Axis:   39 Text Interpretation: Sinus rhythm Atrial premature complexes in couplets Confirmed by Linwood Dibbles [phone]) on 11/30/2022 11:15:35 AM  Radiology CT C-SPINE NO CHARGE  Result Date: 11/30/2022 CLINICAL DATA:  Neck trauma. EXAM: CT Cervical Spine with contrast TECHNIQUE: Multiplanar CT images of the cervical spine were reconstructed from contemporary CT of the Neck. RADIATION DOSE REDUCTION: This exam was performed according to the departmental dose-optimization program which includes automated exposure control, adjustment of the mA and/or kV according to patient size and/or use of iterative reconstruction technique. CONTRAST:  None additional COMPARISON:  None  Available. FINDINGS: Alignment: Normal. Skull base and vertebrae: No acute fracture. No primary bone lesion or focal pathologic process. Soft tissues and spinal canal: No prevertebral fluid or swelling. No visible canal hematoma. Disc levels:  No visible impingement Upper chest: Negative IMPRESSION: Negative neck CT. Electronically Signed   By: Tiburcio Pea M.D.   On: 11/30/2022 11:35   CT ANGIO HEAD NECK W WO CM  Result Date: 11/30/2022 CLINICAL DATA:  Syncope with weakness.  Slurred speech EXAM: CT ANGIOGRAPHY HEAD AND NECK WITH AND WITHOUT CONTRAST TECHNIQUE:  Multidetector CT imaging of the head and neck was performed using the standard protocol during bolus administration of intravenous contrast. Multiplanar CT image reconstructions and MIPs were obtained to evaluate the vascular anatomy. Carotid stenosis measurements (when applicable) are obtained utilizing NASCET criteria, using the distal internal carotid diameter as the denominator. RADIATION DOSE REDUCTION: This exam was performed according to the departmental dose-optimization program which includes automated exposure control, adjustment of the mA and/or kV according to patient size and/or use of iterative reconstruction technique. CONTRAST:  75mL OMNIPAQUE IOHEXOL 350 MG/ML SOLN COMPARISON:  None Available. FINDINGS: CT HEAD FINDINGS Brain: Suboptimal gray-white differentiation due to low-dose technique. No hemorrhage or detected infarct. No hydrocephalus or masslike finding Vascular: See below Skull: Unremarkable Sinuses/Orbits: Unremarkable Review of the MIP images confirms the above findings CTA NECK FINDINGS Aortic arch: Normal with 2 vessel branching Right carotid system: Vessels are smoothly contoured and widely patent Left carotid system: Vessels are smoothly contoured and widely patent Vertebral arteries: No proximal subclavian stenosis. The vertebral arteries are smoothly contoured and widely patent. Skeleton: Negative Other neck: Negative  Upper chest: Negative. Review of the MIP images confirms the above findings CTA HEAD FINDINGS Anterior circulation: No significant stenosis, proximal occlusion, aneurysm, or vascular malformation. Mild irregularity of vessel lumen thick MIPS is likely technical. No segmental beading. Posterior circulation: No significant stenosis, proximal occlusion, aneurysm, or vascular malformation. Venous sinuses: As permitted by contrast timing, patent. Anatomic variants: None unusual Review of the MIP images confirms the above findings IMPRESSION: Negative CTA.  No explanation for symptoms. Electronically Signed   By: Tiburcio Pea M.D.   On: 11/30/2022 11:34   DG Chest Portable 1 View  Result Date: 11/30/2022 CLINICAL DATA:  Weakness EXAM: PORTABLE CHEST 1 VIEW COMPARISON:  X-ray 07/05/2022 FINDINGS: No consolidation, pneumothorax or effusion. No edema. Normal cardiopericardial silhouette. Overlapping cardiac leads. Film is under penetrated IMPRESSION: No acute cardiopulmonary disease. Electronically Signed   By: Karen Kays M.D.   On: 11/30/2022 10:26    Procedures .Critical Care  Performed by: Linwood Dibbles, MD Authorized by: Linwood Dibbles, MD   Critical care provider statement:    Critical care time (minutes):  40   Critical care was time spent personally by me on the following activities:  Development of treatment plan with patient or surrogate, discussions with consultants, evaluation of patient's response to treatment, examination of patient, ordering and review of laboratory studies, ordering and review of radiographic studies, ordering and performing treatments and interventions, pulse oximetry, re-evaluation of patient's condition and review of old charts     Medications Ordered in ED Medications  sodium chloride 0.9 % bolus 500 mL (500 mLs Intravenous New Bag/Given 11/30/22 1047)    Followed by  0.9 %  sodium chloride infusion (100 mL/hr Intravenous New Bag/Given 11/30/22 1047)  ondansetron (ZOFRAN)  injection 4 mg (4 mg Intravenous Given 11/30/22 0952)  iohexol (OMNIPAQUE) 350 MG/ML injection 75 mL (75 mLs Intravenous Contrast Given 11/30/22 1025)  ondansetron (ZOFRAN) injection 4 mg (4 mg Intravenous Given 11/30/22 1101)    ED Course/ Medical Decision Making/ A&P Clinical Course as of 11/30/22 1154  Thu Nov 30, 2022  0959 I-STAT shows normal electrolytes, normal BUN and creatinine, no anemia [JK]  1010 Patient states she feels very weak and tired just wants to sleep.  Will proceed with CT scan imaging.  Patient does not recall if she hit her head but will place a C-spine collar and proceed with CT C-spine imaging as well [JK]  1034 ABG without signs of acidosis.  Fecal occult negative [JK]  1047 Chest x-ray without acute findings [JK]  1103 Neurologic status is improving.  Patient is now answering questions.  She is able to lift both arms off the bed.  Patient states her legs do feel now.  She is able to lift her right leg off the bed against gravity, is able to lift her left leg but has increased difficulty and requires more effort.  Left arm and leg leg still weaker compared to the right [JK]  1115 Patient had another episode of decreased responsiveness.  Bradycardia noted doubt this is the cause of her symptoms.  No hypotension [JK]  1118 Case reviewed with Dr. Selina Cooley.  She recommends admit to [facility] and neurology will consult on patient [JK]  1149 CT angio head and neck without acute findings.  CT C-spine without acute abnormality [JK]    Clinical Course User Index [JK] Linwood Dibbles, MD                             Medical Decision Making Differential diagnosis includes but not limited to stroke, cerebral hemorrhage, syncope, seizure, metabolic derangement  Problems Addressed: Altered mental status, unspecified altered mental status type: acute illness or injury that poses a threat to life or bodily functions Hypokalemia: acute illness or injury that poses a threat to life or  bodily functions Syncope, unspecified syncope type: acute illness or injury that poses a threat to life or bodily functions Weakness: acute illness or injury that poses a threat to life or bodily functions  Amount and/or Complexity of Data Reviewed Labs: ordered. Decision-making details documented in ED Course. Radiology: ordered and independent interpretation performed.  Risk Prescription drug management. Decision regarding hospitalization.   Pt presented to the ED for evaluation of an episode of loss of consciousness, altered mental status.  Patient was present to the ED for evaluation of nausea vomiting and blood in her emesis.  In the ED parking lot patient had an episode of loss of consciousness.  Patient required staff to bring her in room via stretcher.  Patient was unresponsive on arrival but mental status improved.  Patient subsequently was complaining of weakness throughout her entire body.  That started to improve but then she had another episode of decreased responsiveness.  Patient continues to complain of weakness more so on the left side at this time.  Patient does not have any signs of anemia.  No findings to suggest an acute infection.  ABG without signs of hypercarbia or acidosis.  No signs of myocardial infarction.  CT angiogram of head and neck without acute abnormalities including hemorrhage or vascular occlusion.  No evidence of cervical spine injury.  Patient's mental status is diminished although she will answer questions.  No definite seizure activity noted but that is a concern.  Case discussed with neurology Dr. Selina Cooley.  Will now plan on admission to [facility] for further evaluation and possible EEG monitoring.  I will consult the medical service for admission        Final Clinical Impression(s) / ED Diagnoses Final diagnoses:  Syncope, unspecified syncope type  Altered mental status, unspecified altered mental status type  Weakness  Hypokalemia    Rx  / DC Orders ED Discharge Orders     None         Linwood Dibbles, MD 12/01/22 1327

## 2022-11-30 NOTE — Final Progress Note (Signed)
Nurse Dahlia Client called to give me report when I was in the bathroom with a new admit who I just received so I asked her to call me back in 5 minutes so I could get him toileted and back to bed, as he is a seizure patient.

## 2022-12-01 ENCOUNTER — Inpatient Hospital Stay (HOSPITAL_COMMUNITY): Payer: Medicaid Other

## 2022-12-01 DIAGNOSIS — R112 Nausea with vomiting, unspecified: Secondary | ICD-10-CM | POA: Diagnosis not present

## 2022-12-01 DIAGNOSIS — R55 Syncope and collapse: Secondary | ICD-10-CM | POA: Diagnosis not present

## 2022-12-01 LAB — COMPREHENSIVE METABOLIC PANEL
ALT: 45 U/L — ABNORMAL HIGH (ref 0–44)
AST: 31 U/L (ref 15–41)
Albumin: 3.7 g/dL (ref 3.5–5.0)
Alkaline Phosphatase: 87 U/L (ref 38–126)
Anion gap: 12 (ref 5–15)
BUN: 5 mg/dL — ABNORMAL LOW (ref 6–20)
CO2: 22 mmol/L (ref 22–32)
Calcium: 9.4 mg/dL (ref 8.9–10.3)
Chloride: 108 mmol/L (ref 98–111)
Creatinine, Ser: 0.77 mg/dL (ref 0.44–1.00)
GFR, Estimated: >60 mL/min (ref >60)
Glucose, Bld: 97 mg/dL (ref 70–99)
Potassium: 3.5 mmol/L (ref 3.5–5.1)
Sodium: 142 mmol/L (ref 135–145)
Total Bilirubin: 1.1 mg/dL (ref 0.3–1.2)
Total Protein: 6.8 g/dL (ref 6.5–8.1)

## 2022-12-01 LAB — CBC
HCT: 42.2 % (ref 36.0–46.0)
Hemoglobin: 14 g/dL (ref 12.0–15.0)
MCH: 32.3 pg (ref 26.0–34.0)
MCHC: 33.2 g/dL (ref 30.0–36.0)
MCV: 97.5 fL (ref 80.0–100.0)
Platelets: 195 10*3/uL (ref 150–400)
RBC: 4.33 MIL/uL (ref 3.87–5.11)
RDW: 14.4 % (ref 11.5–15.5)
WBC: 8.5 10*3/uL (ref 4.0–10.5)
nRBC: 0 % (ref 0.0–0.2)

## 2022-12-01 LAB — LIPASE, BLOOD: Lipase: 19 U/L (ref 11–51)

## 2022-12-01 MED ORDER — SUCRALFATE 1 GM/10ML PO SUSP
1.0000 g | Freq: Three times a day (TID) | ORAL | Status: DC
  Administered 2022-12-01 – 2022-12-03 (×8): 1 g via ORAL
  Filled 2022-12-01 (×11): qty 10

## 2022-12-01 MED ORDER — HYDROMORPHONE HCL 1 MG/ML IJ SOLN
1.0000 mg | Freq: Once | INTRAMUSCULAR | Status: AC
  Administered 2022-12-01: 1 mg via INTRAVENOUS
  Filled 2022-12-01: qty 1

## 2022-12-01 MED ORDER — METOCLOPRAMIDE HCL 5 MG/ML IJ SOLN
5.0000 mg | Freq: Four times a day (QID) | INTRAMUSCULAR | Status: DC
  Administered 2022-12-01 – 2022-12-03 (×9): 5 mg via INTRAVENOUS
  Filled 2022-12-01 (×9): qty 2

## 2022-12-01 NOTE — Progress Notes (Signed)
Patient off the unit for MRI.

## 2022-12-01 NOTE — Plan of Care (Signed)
  Problem: Education: Goal: Knowledge of General Education information will improve Description: Including pain rating scale, medication(s)/side effects and non-pharmacologic comfort measures Outcome: Progressing   Problem: Clinical Measurements: Goal: Respiratory complications will improve Outcome: Progressing   Problem: Activity: Goal: Risk for activity intolerance will decrease Outcome: Progressing   Problem: Coping: Goal: Level of anxiety will decrease Outcome: Progressing   Problem: Elimination: Goal: Will not experience complications related to bowel motility Outcome: Progressing Goal: Will not experience complications related to urinary retention Outcome: Progressing   Problem: Safety: Goal: Ability to remain free from injury will improve Outcome: Progressing

## 2022-12-01 NOTE — TOC Initial Note (Signed)
Transition of Care [facility]) - Initial/Assessment Note    Patient Details  Name: Glenda Kim MRN: [id number] Date of Birth: [birth year]  [facility] CM/SW Contact:    Ronny Bacon, RN Phone Number: 12/01/2022, 1:48 PM  Clinical Narrative:   Spoke with patient at bedside. Lives with partner and they will transport patient home upon discharge. Patient does not have any DME equipment at home. Does not have a PCP, patient encouraged to look on her Medicaid Card to see if one has been assigned to her already. TOC will continue to follow for any TOC needs.                Expected Discharge Plan: Home/Self Care Barriers to Discharge: Continued Medical Work up   Patient Goals and CMS Choice Patient states their goals for this hospitalization and ongoing recovery are:: To go home          Expected Discharge Plan and Services       Living arrangements for the past 2 months: Apartment                                      Prior Living Arrangements/Services Living arrangements for the past 2 months: Apartment Lives with:: Domestic Partner Patient language and need for interpreter reviewed:: Yes Do you feel safe going back to the place where you live?: Yes      Need for Family Participation in Patient Care: No (Comment) Care giver support system in place?: Yes (comment)   Criminal Activity/Legal Involvement Pertinent to Current Situation/Hospitalization: No - Comment as needed  Activities of Daily Living Home Assistive Devices/Equipment: Walker (specify type) ADL Screening (condition at time of admission) Patient's cognitive ability adequate to safely complete daily activities?: Yes Is the patient deaf or have difficulty hearing?: No Does the patient have difficulty seeing, even when wearing glasses/contacts?: No Does the patient have difficulty concentrating, remembering, or making decisions?: Yes Patient able to express need for assistance with ADLs?:  No Does the patient have difficulty dressing or bathing?: No Independently performs ADLs?: Yes (appropriate for developmental age) Does the patient have difficulty walking or climbing stairs?: No Weakness of Legs: None Weakness of Arms/Hands: None  Permission Sought/Granted                  Emotional Assessment Appearance:: Appears stated age Attitude/Demeanor/Rapport: Engaged Affect (typically observed): Appropriate Orientation: : Oriented to Self, Oriented to Place, Oriented to  Time, Oriented to Situation Alcohol / Substance Use: Not Applicable Psych Involvement: No (comment)  Admission diagnosis:  Hypokalemia [E87.6] Seizure (HCC) [R56.9] Weakness [R53.1] Altered mental status, unspecified altered mental status type [R41.82] Syncope, unspecified syncope type [R55] Patient Active Problem List   Diagnosis Date Noted   Seizure (HCC) 11/30/2022   Sleep paralysis 11/03/2021   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) 06/30/2021   Intractable cluster headache syndrome 06/30/2021   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 06/30/2021   Insomnia 06/30/2021   Excessive daytime sleepiness 06/30/2021   Sleep paralysis, recurrent isolated 06/30/2021   Gastroparesis 03/21/2021   Cyclic vomiting syndrome 03/21/2021   Morbid obesity (HCC) 06/11/2017   PCP:  Arnette Felts, FNP Pharmacy:   [id number] - [place], [place] - [address] [place] [address] Phone: [phone] Fax: [phone]     Social  Determinants of Health (SDOH) Social History: SDOH Screenings   Food Insecurity: No Food Insecurity (11/30/2022)  Housing: Low Risk  (11/30/2022)  Transportation Needs: No Transportation Needs (11/30/2022)  Utilities: Not At Risk (11/30/2022)  Depression (PHQ2-9): Medium Risk (02/02/2022)  Tobacco Use: Medium Risk (11/30/2022)   SDOH Interventions:     Readmission Risk  Interventions     No data to display

## 2022-12-01 NOTE — Progress Notes (Addendum)
[facility] PROGRESS NOTE   Glenda Kim ZOX:[id number] DOB: 04/02/2001 DOA: 11/30/2022  PCP: Arnette Felts, FNP  Brief History/Interval Summary: 22 y.o. 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  There was concern for seizure-like activity.  Patient was hospitalized for further management.    Consultants: None yet  Procedures: EEG did not show any seizure or epileptiform activity    Subjective/Interval History: Patient mentions that she is feeling much better today compared to yesterday.  She has chronic nausea and abdominal pain but is better compared to yesterday.  Has been tolerating her liquid diet.  Ready for solid foods.  Has been ambulating to the bathroom and back.    Assessment/Plan:  Syncopal episode Likely vasovagal.  No focal neurological deficits noted.  There was some concern for seizure type activity when she had her syncopal episode.  EEG is negative.  Will proceed with MRI brain.  If MRI brain is negative do not anticipate any further testing.  Nausea and vomiting in the setting of recent GI surgery done at [facility]. Some of the symptoms are chronic.  Abdomen is benign for the most part.  She has tolerated liquid diet.  She is afebrile. Will advance her diet and monitor. Labs are pending from this morning.  ADDENDUM Patient had recurrent nausea vomiting and abdominal pain when she went down for MRI.  Patient was reexamined.  Abdominal x-ray was done which was unremarkable.  LFTs and lipase levels normal.  She was given metoclopramide, Dilaudid x 1 and Carafate.  Symptoms seem to be better.  EKG did not show any acute changes.  Continue to monitor for now.  May need to consider CT abdomen/pelvis if her symptoms recur.  PICC Line  Infection Postoperatively 2023 patient had PICC line for TPN, this was removed in April due to concern for bacteremia.  Apparently outpatient blood cultures at a different facility grew Staph epidermidis, and MRSA.  Blood cultures at this facility drawn on 4/15 have been negative.  There was some mention that patient was on linezolid.  Not listed on her reconcile home medication list.  Discussed with patient.  She is no longer on linezolid.  Noted to be afebrile.  Continue to monitor for now.  Obesity Estimated body mass index is 53.03 kg/m as calculated from the following:   Height as of this encounter: 5\' 7"  (1.702 m).   Weight as of this encounter: 153.6 kg.   DVT Prophylaxis: SCDs Code Status: Full code Family Communication: Discussed with patient Disposition Plan: Hopefully return home when improved      Medications: Scheduled:  pantoprazole (PROTONIX) IV  40 mg Intravenous Q12H   Continuous:  sodium chloride 100 mL/hr (12/01/22 0738)   WJX:BJYNWGNFAOZHY **OR** acetaminophen, albuterol, hydrALAZINE, metoCLOPramide (REGLAN) injection, ondansetron **OR** ondansetron (ZOFRAN) IV, polyethylene glycol, traZODone  Antibiotics: Anti-infectives (From admission, onward)    None       Objective:  Vital Signs  Vitals:   11/30/22 2013 11/30/22 2309 12/01/22 0317 12/01/22 0737  BP:  (!) 95/46 123/78 98/73  Pulse:  (!) 56 (!) 50 (!) 58  Resp:  17 18 18   Temp:  98.6 F (37 C) 98.2 F (36.8 C) 98.2 F (36.8 C)  TempSrc:  Oral Oral Oral  SpO2:  98% 99%   Weight: (!) 153.6 kg     Height: 5\' 7"  (1.702 m)  Intake/Output Summary (Last 24 hours) at 12/01/2022 0858 Last data filed at 12/01/2022 0400 Gross per 24 hour  Intake 1370 ml  Output --  Net 1370 ml   Filed Weights   December 01, 2022 2013  Weight: (!) 153.6 kg    General appearance: Awake alert.  In no distress Resp: Clear to auscultation bilaterally.  Normal effort Cardio: S1-S2 is normal regular.  No S3-S4.  No  rubs murmurs or bruit GI: Abdomen is soft.  Diffusely tender without any rebound rigidity or guarding.  No masses organomegaly. Extremities: No edema.  Full range of motion of lower extremities. Neurologic: Alert and oriented x3.  No focal neurological deficits.    Lab Results:  Data Reviewed: I have personally reviewed following labs and reports of the imaging studies  CBC: Recent Labs  Lab 12-01-2022 0944 01-Dec-2022 0956  WBC 10.6*  --   NEUTROABS 9.2*  --   HGB 13.0 14.3  HCT 40.2 42.0  MCV 97.3  --   PLT 215  --     Basic Metabolic Panel: Recent Labs  Lab 01-Dec-2022 0944 12-01-2022 0956  NA 139 142  K 3.3* 3.6  CL 106 105  CO2 22  --   GLUCOSE 110* 113*  BUN 6 3*  CREATININE 0.67 0.60  CALCIUM 9.4  --     GFR: Estimated Creatinine Clearance (by C-G formula based on SCr of 0.6 mg/dL) Female: 161.0 mL/min Female: 203.6 mL/min  Liver Function Tests: Recent Labs  Lab 12-01-22 0944  AST 27  ALT 42  ALKPHOS 86  BILITOT 1.2  PROT 7.4  ALBUMIN 3.9    Coagulation Profile: Recent Labs  Lab December 01, 2022 0944  INR 1.2     Radiology Studies: EEG adult  Result Date: December 01, 2022 Charlsie Quest, MD     12-01-2022 10:02 PM Patient Name: Glenda Kim MRN: [id number] Epilepsy Attending: Charlsie Quest Referring Physician/Provider: Maryln Gottron, MD Date: Dec 01, 2022 Duration: 23.18 mins Patient history: 22yo F with syncope getting eeg to evaluate for seizure Level of alertness: Awake, asleep AEDs during EEG study: Technical aspects: This EEG study was done with scalp electrodes positioned according to the 10-20 International system of electrode placement. Electrical activity was reviewed with band pass filter of 1-70Hz , sensitivity of 7 uV/mm, display speed of 46mm/sec with a 60Hz  notched filter applied as appropriate. EEG data were recorded continuously and digitally stored.  Video monitoring was available and reviewed as appropriate. Description: The posterior dominant  rhythm consists of 9 Hz activity of moderate voltage (25-35 uV) seen predominantly in posterior head regions, symmetric and reactive to eye opening and eye closing. Sleep was characterized by vertex waves, sleep spindles (12 to 14 Hz), maximal frontocentral region. Hyperventilation and photic stimulation were not performed.   IMPRESSION: This study is within normal limits. No seizures or epileptiform discharges were seen throughout the recording. A normal interictal EEG does not exclude the diagnosis of epilepsy. Charlsie Quest   CT C-SPINE NO CHARGE  Result Date: 01-Dec-2022 CLINICAL DATA:  Neck trauma. EXAM: CT Cervical Spine with contrast TECHNIQUE: Multiplanar CT images of the cervical spine were reconstructed from contemporary CT of the Neck. RADIATION DOSE REDUCTION: This exam was performed according to the departmental dose-optimization program which includes automated exposure control, adjustment of the mA and/or kV according to patient size and/or use of iterative reconstruction technique. CONTRAST:  None additional COMPARISON:  None Available. FINDINGS: Alignment: Normal. Skull base and vertebrae: No acute fracture. No primary bone lesion or focal  pathologic process. Soft tissues and spinal canal: No prevertebral fluid or swelling. No visible canal hematoma. Disc levels:  No visible impingement Upper chest: Negative IMPRESSION: Negative neck CT. Electronically Signed   By: Tiburcio Pea M.D.   On: 11/30/2022 11:35   CT ANGIO HEAD NECK W WO CM  Result Date: 11/30/2022 CLINICAL DATA:  Syncope with weakness.  Slurred speech EXAM: CT ANGIOGRAPHY HEAD AND NECK WITH AND WITHOUT CONTRAST TECHNIQUE: Multidetector CT imaging of the head and neck was performed using the standard protocol during bolus administration of intravenous contrast. Multiplanar CT image reconstructions and MIPs were obtained to evaluate the vascular anatomy. Carotid stenosis measurements (when applicable) are obtained utilizing  NASCET criteria, using the distal internal carotid diameter as the denominator. RADIATION DOSE REDUCTION: This exam was performed according to the departmental dose-optimization program which includes automated exposure control, adjustment of the mA and/or kV according to patient size and/or use of iterative reconstruction technique. CONTRAST:  75mL OMNIPAQUE IOHEXOL 350 MG/ML SOLN COMPARISON:  None Available. FINDINGS: CT HEAD FINDINGS Brain: Suboptimal gray-white differentiation due to low-dose technique. No hemorrhage or detected infarct. No hydrocephalus or masslike finding Vascular: See below Skull: Unremarkable Sinuses/Orbits: Unremarkable Review of the MIP images confirms the above findings CTA NECK FINDINGS Aortic arch: Normal with 2 vessel branching Right carotid system: Vessels are smoothly contoured and widely patent Left carotid system: Vessels are smoothly contoured and widely patent Vertebral arteries: No proximal subclavian stenosis. The vertebral arteries are smoothly contoured and widely patent. Skeleton: Negative Other neck: Negative Upper chest: Negative. Review of the MIP images confirms the above findings CTA HEAD FINDINGS Anterior circulation: No significant stenosis, proximal occlusion, aneurysm, or vascular malformation. Mild irregularity of vessel lumen thick MIPS is likely technical. No segmental beading. Posterior circulation: No significant stenosis, proximal occlusion, aneurysm, or vascular malformation. Venous sinuses: As permitted by contrast timing, patent. Anatomic variants: None unusual Review of the MIP images confirms the above findings IMPRESSION: Negative CTA.  No explanation for symptoms. Electronically Signed   By: Tiburcio Pea M.D.   On: 11/30/2022 11:34   DG Chest Portable 1 View  Result Date: 11/30/2022 CLINICAL DATA:  Weakness EXAM: PORTABLE CHEST 1 VIEW COMPARISON:  X-ray 07/05/2022 FINDINGS: No consolidation, pneumothorax or effusion. No edema. Normal  cardiopericardial silhouette. Overlapping cardiac leads. Film is under penetrated IMPRESSION: No acute cardiopulmonary disease. Electronically Signed   By: Karen Kays M.D.   On: 11/30/2022 10:26       LOS: 1 day   Glenda Kim  [facility] Pager on [email or web address]  12/01/2022, 8:58 AM

## 2022-12-01 NOTE — Progress Notes (Signed)
Patient back to unit, having nausea, gave her PRN Zofran, will continue to monitotr

## 2022-12-01 NOTE — Progress Notes (Signed)
Patient back to unit.

## 2022-12-01 NOTE — Plan of Care (Signed)

## 2022-12-02 ENCOUNTER — Inpatient Hospital Stay (HOSPITAL_COMMUNITY): Payer: Medicaid Other

## 2022-12-02 DIAGNOSIS — R55 Syncope and collapse: Secondary | ICD-10-CM

## 2022-12-02 DIAGNOSIS — E876 Hypokalemia: Secondary | ICD-10-CM

## 2022-12-02 DIAGNOSIS — R112 Nausea with vomiting, unspecified: Secondary | ICD-10-CM | POA: Diagnosis not present

## 2022-12-02 LAB — ECHOCARDIOGRAM COMPLETE
AR max vel: 2.89 cm2
AV Area VTI: 2.78 cm2
AV Area mean vel: 2.72 cm2
AV Mean grad: 3 mmHg
AV Peak grad: 6.1 mmHg
Ao pk vel: 1.23 m/s
Area-P 1/2: 3.02 cm2
Height: 67 in
S' Lateral: 2.7 cm
Weight: 5417.6 oz

## 2022-12-02 LAB — CBC
HCT: 34 % — ABNORMAL LOW (ref 36.0–46.0)
Hemoglobin: 11.1 g/dL — ABNORMAL LOW (ref 12.0–15.0)
MCH: 31.2 pg (ref 26.0–34.0)
MCHC: 32.6 g/dL (ref 30.0–36.0)
MCV: 95.5 fL (ref 80.0–100.0)
Platelets: 174 10*3/uL (ref 150–400)
RBC: 3.56 MIL/uL — ABNORMAL LOW (ref 3.87–5.11)
RDW: 14.4 % (ref 11.5–15.5)
WBC: 7.4 10*3/uL (ref 4.0–10.5)
nRBC: 0 % (ref 0.0–0.2)

## 2022-12-02 LAB — COMPREHENSIVE METABOLIC PANEL
ALT: 40 U/L (ref 0–44)
AST: 30 U/L (ref 15–41)
Albumin: 2.8 g/dL — ABNORMAL LOW (ref 3.5–5.0)
Alkaline Phosphatase: 72 U/L (ref 38–126)
Anion gap: 8 (ref 5–15)
BUN: <5 mg/dL — ABNORMAL LOW (ref 6–20)
CO2: 26 mmol/L (ref 22–32)
Calcium: 8.5 mg/dL — ABNORMAL LOW (ref 8.9–10.3)
Chloride: 109 mmol/L (ref 98–111)
Creatinine, Ser: 0.77 mg/dL (ref 0.44–1.00)
GFR, Estimated: >60 mL/min (ref >60)
Glucose, Bld: 94 mg/dL (ref 70–99)
Potassium: 3.1 mmol/L — ABNORMAL LOW (ref 3.5–5.1)
Sodium: 143 mmol/L (ref 135–145)
Total Bilirubin: 0.7 mg/dL (ref 0.3–1.2)
Total Protein: 5 g/dL — ABNORMAL LOW (ref 6.5–8.1)

## 2022-12-02 LAB — LIPASE, BLOOD: Lipase: 20 U/L (ref 11–51)

## 2022-12-02 LAB — MAGNESIUM: Magnesium: 1.8 mg/dL (ref 1.7–2.4)

## 2022-12-02 LAB — CBG MONITORING, ED: Glucose-Capillary: 108 mg/dL — ABNORMAL HIGH (ref 70–99)

## 2022-12-02 MED ORDER — HYDROMORPHONE HCL 1 MG/ML IJ SOLN
0.5000 mg | INTRAMUSCULAR | Status: DC | PRN
  Administered 2022-12-02 (×3): 0.5 mg via INTRAVENOUS
  Filled 2022-12-02 (×3): qty 0.5

## 2022-12-02 MED ORDER — IOHEXOL 9 MG/ML PO SOLN
500.0000 mL | ORAL | Status: AC
  Administered 2022-12-02 (×2): 500 mL via ORAL

## 2022-12-02 MED ORDER — POTASSIUM CHLORIDE 10 MEQ/100ML IV SOLN
10.0000 meq | INTRAVENOUS | Status: AC
  Administered 2022-12-02 (×4): 10 meq via INTRAVENOUS
  Filled 2022-12-02 (×4): qty 100

## 2022-12-02 MED ORDER — SODIUM CHLORIDE 0.9 % IV SOLN
12.5000 mg | Freq: Once | INTRAVENOUS | Status: AC
  Administered 2022-12-02: 12.5 mg via INTRAVENOUS
  Filled 2022-12-02: qty 12.5

## 2022-12-02 MED ORDER — MAGNESIUM SULFATE 2 GM/50ML IV SOLN
2.0000 g | Freq: Once | INTRAVENOUS | Status: AC
  Administered 2022-12-02: 2 g via INTRAVENOUS
  Filled 2022-12-02: qty 50

## 2022-12-02 MED ORDER — POTASSIUM CHLORIDE IN NACL 20-0.9 MEQ/L-% IV SOLN
INTRAVENOUS | Status: AC
  Filled 2022-12-02 (×2): qty 1000

## 2022-12-02 MED ORDER — IOHEXOL 350 MG/ML SOLN
100.0000 mL | Freq: Once | INTRAVENOUS | Status: AC | PRN
  Administered 2022-12-02: 100 mL via INTRAVENOUS

## 2022-12-02 NOTE — Progress Notes (Signed)
patient off the unit  for CT.

## 2022-12-02 NOTE — Progress Notes (Signed)
[facility] PROGRESS NOTE   Twana Wileman QVZ:[id number] DOB: [birth year] DOA: 11/30/2022  PCP: Arnette Felts, FNP  Brief History/Interval Summary: 22 y.o. 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  There was concern for seizure-like activity.  Patient was hospitalized for further management.    Consultants: None yet  Procedures: EEG did not show any seizure or epileptiform activity    Subjective/Interval History: Patient continues to have abdominal pain.  No vomiting since yesterday afternoon.  No other complaints offered.  No chest pain.    Assessment/Plan:  Syncopal episode Likely vasovagal.  No focal neurological deficits noted.  There was some concern for seizure type activity when she had her syncopal episode.  EEG is negative.  MRI brain is negative.  No further neurological workup indicated at this time.   Echocardiogram will be ordered.  Nausea and vomiting in the setting of recent GI surgery done at [facility]. Some of her symptoms are chronic.  Abdominal film was unremarkable.  Patient had multiple episodes of vomiting yesterday as well.  Also complains of abdominal pain mainly in the epigastric area.  Lipase level was normal.  LFTs unremarkable. Proceed with CT scan of the abdomen and pelvis.   Continue with metoclopramide for possible gastroparesis.  Continue with Carafate and PPI. If CT scan does not show any acute findings may need to involve gastroenterology if her symptoms persist.    Recent PICC Line Infection Postoperatively 2023 patient had PICC line for TPN, this was removed in April due to concern for bacteremia.  Apparently outpatient blood cultures at a different facility grew Staph epidermidis, and MRSA.  Blood cultures at this facility drawn on 4/15  have been negative.  There was some mention that patient was on linezolid.  Not listed on her reconcile home medication list.   Patient mentioned that she has completed course of linezolid.  She has been afebrile.  Continue to monitor for now.    Hypokalemia Will be repleted.  Check magnesium level.  Sinus bradycardia Check TSH.  Not on any AV blocking agents.  Normocytic anemia Drop in hemoglobin is dilutional.  No evidence of overt blood loss.  Check anemia panel.  Obesity Estimated body mass index is 53.03 kg/m as calculated from the following:   Height as of this encounter: 5\' 7"  (1.702 m).   Weight as of this encounter: 153.6 kg.   DVT Prophylaxis: SCDs Code Status: Full code Family Communication: Discussed with patient Disposition Plan: Hopefully return home when improved      Medications: Scheduled:  metoCLOPramide (REGLAN) injection  5 mg Intravenous Q6H   pantoprazole (PROTONIX) IV  40 mg Intravenous Q12H   sucralfate  1 g Oral TID WC & HS   Continuous:  sodium chloride 100 mL/hr (12/01/22 0738)   potassium chloride 10 mEq (12/02/22 0900)   PIR:JJOACZYSAYTKZ **OR** acetaminophen, albuterol, hydrALAZINE, HYDROmorphone (DILAUDID) injection, metoCLOPramide (REGLAN) injection, ondansetron **OR** ondansetron (ZOFRAN) IV, polyethylene glycol, traZODone   Objective:  Vital Signs  Vitals:   12/01/22 2305 12/02/22 0325 12/02/22 0325 12/02/22 0734  BP: 123/72 118/79 118/79 128/87  Pulse: (!) 44 (!) 45 (!) 45 (!) 45  Resp:    18  Temp: 98.1 F (36.7 C) 97.7 F (36.5 C) 97.7 F (36.5 C) 98.6 F (37 C)  TempSrc: Oral Oral Oral Oral  SpO2: 96% 97% 97% 99%  Weight:  Height:       No intake or output data in the 24 hours ending 12/02/22 0909  Filed Weights   11/30/22 2013  Weight: (!) 153.6 kg    General appearance: Awake alert.  In no distress Resp: Clear to auscultation bilaterally.  Normal effort Cardio: S1-S2 is normal regular.  No S3-S4.  No rubs  murmurs or bruit GI: Abdomen is soft.  Tender in the epigastric area without any rebound rigidity or guarding.  No masses organomegaly. Extremities: No edema.  Full range of motion of lower extremities. Neurologic: Alert and oriented x3.  No focal neurological deficits.    Lab Results:  Data Reviewed: I have personally reviewed following labs and reports of the imaging studies  CBC: Recent Labs  Lab 11/30/22 0944 11/30/22 0956 12/01/22 1327 12/02/22 0315  WBC 10.6*  --  8.5 7.4  NEUTROABS 9.2*  --   --   --   HGB 13.0 14.3 14.0 11.1*  HCT 40.2 42.0 42.2 34.0*  MCV 97.3  --  97.5 95.5  PLT 215  --  195 174     Basic Metabolic Panel: Recent Labs  Lab 11/30/22 0944 11/30/22 0956 12/01/22 1327 12/02/22 0315  NA 139 142 142 143  K 3.3* 3.6 3.5 3.1*  CL 106 105 108 109  CO2 22  --  22 26  GLUCOSE 110* 113* 97 94  BUN 6 3* 5* <5*  CREATININE 0.67 0.60 0.77 0.77  CALCIUM 9.4  --  9.4 8.5*     GFR: Estimated Creatinine Clearance (by C-G formula based on SCr of 0.77 mg/dL) Female: 161.0 mL/min Female: 203.6 mL/min  Liver Function Tests: Recent Labs  Lab 11/30/22 0944 12/01/22 1327 12/02/22 0315  AST 27 31 30   ALT 42 45* 40  ALKPHOS 86 87 72  BILITOT 1.2 1.1 0.7  PROT 7.4 6.8 5.0*  ALBUMIN 3.9 3.7 2.8*     Coagulation Profile: Recent Labs  Lab 11/30/22 0944  INR 1.2      Radiology Studies: MR BRAIN WO CONTRAST  Result Date: 12/01/2022 CLINICAL DATA:  Seizure, new-onset, no history of trauma EXAM: MRI HEAD WITHOUT CONTRAST TECHNIQUE: Multiplanar, multiecho pulse sequences of the brain and surrounding structures were obtained without intravenous contrast. COMPARISON:  None Available. FINDINGS: Brain: No acute infarction, hemorrhage, hydrocephalus, extra-axial collection or mass lesion. Partially empty sella. Vascular: Major arterial flow voids are maintained. Skull and upper cervical spine: Normal marrow signal. Sinuses/Orbits: Clear sinuses.  No acute  orbital findings. Other: No mastoid effusions. IMPRESSION: No acute abnormality.  No explanation for seizures. Electronically Signed   By: Feliberto Harts M.D.   On: 12/01/2022 20:51   DG Abd 1 View  Result Date: 12/01/2022 CLINICAL DATA:  Nausea, vomiting, abdominal pain. EXAM: ABDOMEN - 1 VIEW COMPARISON:  CT abdomen/pelvis 08/04/2022. FINDINGS: There is a nonobstructive bowel gas pattern. There is a no definite free intraperitoneal air, within the confines of supine technique. There is no gross organomegaly or abnormal soft tissue calcification. The bones are unremarkable. IMPRESSION: Unremarkable KUB. Electronically Signed   By: Lesia Hausen M.D.   On: 12/01/2022 14:22   EEG adult  Result Date: 11/30/2022 Charlsie Quest, MD     11/30/2022 10:02 PM Patient Name: Glenda Kim MRN: [id number] Epilepsy Attending: Charlsie Quest Referring Physician/Provider: Maryln Gottron, MD Date: 11/30/2022 Duration: 23.18 mins Patient history: 22yo F with syncope getting eeg to evaluate for seizure Level of alertness: Awake, asleep AEDs during EEG study: Technical aspects:  This EEG study was done with scalp electrodes positioned according to the 10-20 International system of electrode placement. Electrical activity was reviewed with band pass filter of 1-70Hz , sensitivity of 7 uV/mm, display speed of 4mm/sec with a 60Hz  notched filter applied as appropriate. EEG data were recorded continuously and digitally stored.  Video monitoring was available and reviewed as appropriate. Description: The posterior dominant rhythm consists of 9 Hz activity of moderate voltage (25-35 uV) seen predominantly in posterior head regions, symmetric and reactive to eye opening and eye closing. Sleep was characterized by vertex waves, sleep spindles (12 to 14 Hz), maximal frontocentral region. Hyperventilation and photic stimulation were not performed.   IMPRESSION: This study is within normal limits. No seizures or epileptiform  discharges were seen throughout the recording. A normal interictal EEG does not exclude the diagnosis of epilepsy. Charlsie Quest   CT C-SPINE NO CHARGE  Result Date: 11/30/2022 CLINICAL DATA:  Neck trauma. EXAM: CT Cervical Spine with contrast TECHNIQUE: Multiplanar CT images of the cervical spine were reconstructed from contemporary CT of the Neck. RADIATION DOSE REDUCTION: This exam was performed according to the departmental dose-optimization program which includes automated exposure control, adjustment of the mA and/or kV according to patient size and/or use of iterative reconstruction technique. CONTRAST:  None additional COMPARISON:  None Available. FINDINGS: Alignment: Normal. Skull base and vertebrae: No acute fracture. No primary bone lesion or focal pathologic process. Soft tissues and spinal canal: No prevertebral fluid or swelling. No visible canal hematoma. Disc levels:  No visible impingement Upper chest: Negative IMPRESSION: Negative neck CT. Electronically Signed   By: Tiburcio Pea M.D.   On: 11/30/2022 11:35   CT ANGIO HEAD NECK W WO CM  Result Date: 11/30/2022 CLINICAL DATA:  Syncope with weakness.  Slurred speech EXAM: CT ANGIOGRAPHY HEAD AND NECK WITH AND WITHOUT CONTRAST TECHNIQUE: Multidetector CT imaging of the head and neck was performed using the standard protocol during bolus administration of intravenous contrast. Multiplanar CT image reconstructions and MIPs were obtained to evaluate the vascular anatomy. Carotid stenosis measurements (when applicable) are obtained utilizing NASCET criteria, using the distal internal carotid diameter as the denominator. RADIATION DOSE REDUCTION: This exam was performed according to the departmental dose-optimization program which includes automated exposure control, adjustment of the mA and/or kV according to patient size and/or use of iterative reconstruction technique. CONTRAST:  75mL OMNIPAQUE IOHEXOL 350 MG/ML SOLN COMPARISON:  None  Available. FINDINGS: CT HEAD FINDINGS Brain: Suboptimal gray-white differentiation due to low-dose technique. No hemorrhage or detected infarct. No hydrocephalus or masslike finding Vascular: See below Skull: Unremarkable Sinuses/Orbits: Unremarkable Review of the MIP images confirms the above findings CTA NECK FINDINGS Aortic arch: Normal with 2 vessel branching Right carotid system: Vessels are smoothly contoured and widely patent Left carotid system: Vessels are smoothly contoured and widely patent Vertebral arteries: No proximal subclavian stenosis. The vertebral arteries are smoothly contoured and widely patent. Skeleton: Negative Other neck: Negative Upper chest: Negative. Review of the MIP images confirms the above findings CTA HEAD FINDINGS Anterior circulation: No significant stenosis, proximal occlusion, aneurysm, or vascular malformation. Mild irregularity of vessel lumen thick MIPS is likely technical. No segmental beading. Posterior circulation: No significant stenosis, proximal occlusion, aneurysm, or vascular malformation. Venous sinuses: As permitted by contrast timing, patent. Anatomic variants: None unusual Review of the MIP images confirms the above findings IMPRESSION: Negative CTA.  No explanation for symptoms. Electronically Signed   By: Tiburcio Pea M.D.   On: 11/30/2022 11:34  DG Chest Portable 1 View  Result Date: 11/30/2022 CLINICAL DATA:  Weakness EXAM: PORTABLE CHEST 1 VIEW COMPARISON:  X-ray 07/05/2022 FINDINGS: No consolidation, pneumothorax or effusion. No edema. Normal cardiopericardial silhouette. Overlapping cardiac leads. Film is under penetrated IMPRESSION: No acute cardiopulmonary disease. Electronically Signed   By: Karen Kays M.D.   On: 11/30/2022 10:26       LOS: 2 days   Dalaya Suppa Rito Ehrlich  [facility] Pager on [email or web address]  12/02/2022, 9:09 AM

## 2022-12-02 NOTE — Progress Notes (Signed)
Patient back to unit.

## 2022-12-02 NOTE — Plan of Care (Signed)
  Problem: Education: Goal: Knowledge of General Education information will improve Description: Including pain rating scale, medication(s)/side effects and non-pharmacologic comfort measures Outcome: Progressing   Problem: Clinical Measurements: Goal: Respiratory complications will improve Outcome: Progressing   Problem: Activity: Goal: Risk for activity intolerance will decrease Outcome: Progressing   Problem: Coping: Goal: Level of anxiety will decrease Outcome: Progressing   Problem: Elimination: Goal: Will not experience complications related to urinary retention Outcome: Progressing

## 2022-12-02 NOTE — Progress Notes (Signed)
Patient off the unit for ECHO

## 2022-12-03 DIAGNOSIS — R55 Syncope and collapse: Secondary | ICD-10-CM | POA: Diagnosis not present

## 2022-12-03 LAB — CBC
HCT: 35.8 % — ABNORMAL LOW (ref 36.0–46.0)
Hemoglobin: 11.6 g/dL — ABNORMAL LOW (ref 12.0–15.0)
MCH: 31.4 pg (ref 26.0–34.0)
MCHC: 32.4 g/dL (ref 30.0–36.0)
MCV: 97 fL (ref 80.0–100.0)
Platelets: 172 10*3/uL (ref 150–400)
RBC: 3.69 MIL/uL — ABNORMAL LOW (ref 3.87–5.11)
RDW: 14.2 % (ref 11.5–15.5)
WBC: 6.6 10*3/uL (ref 4.0–10.5)
nRBC: 0 % (ref 0.0–0.2)

## 2022-12-03 LAB — COMPREHENSIVE METABOLIC PANEL
ALT: 54 U/L — ABNORMAL HIGH (ref 0–44)
AST: 40 U/L (ref 15–41)
Albumin: 2.7 g/dL — ABNORMAL LOW (ref 3.5–5.0)
Alkaline Phosphatase: 88 U/L (ref 38–126)
Anion gap: 7 (ref 5–15)
BUN: <5 mg/dL — ABNORMAL LOW (ref 6–20)
CO2: 25 mmol/L (ref 22–32)
Calcium: 8.6 mg/dL — ABNORMAL LOW (ref 8.9–10.3)
Chloride: 108 mmol/L (ref 98–111)
Creatinine, Ser: 0.74 mg/dL (ref 0.44–1.00)
GFR, Estimated: >60 mL/min (ref >60)
Glucose, Bld: 85 mg/dL (ref 70–99)
Potassium: 3.5 mmol/L (ref 3.5–5.1)
Sodium: 140 mmol/L (ref 135–145)
Total Bilirubin: 0.7 mg/dL (ref 0.3–1.2)
Total Protein: 5.1 g/dL — ABNORMAL LOW (ref 6.5–8.1)

## 2022-12-03 LAB — TSH: TSH: 5.741 u[IU]/mL — ABNORMAL HIGH (ref 0.350–4.500)

## 2022-12-03 LAB — MAGNESIUM: Magnesium: 1.9 mg/dL (ref 1.7–2.4)

## 2022-12-03 MED ORDER — METOCLOPRAMIDE HCL 5 MG PO TABS: 5.0000 mg | ORAL_TABLET | Freq: Three times a day (TID) | ORAL | 1 refills | Status: DC

## 2022-12-03 MED ORDER — PANTOPRAZOLE SODIUM 40 MG PO TBEC: 40.0000 mg | DELAYED_RELEASE_TABLET | Freq: Every day | ORAL | 1 refills | Status: DC

## 2022-12-03 MED ORDER — SUCRALFATE 1 GM/10ML PO SUSP: 1.0000 g | Freq: Three times a day (TID) | ORAL | 0 refills | Status: DC

## 2022-12-03 NOTE — Plan of Care (Signed)

## 2022-12-03 NOTE — Discharge Summary (Signed)
[facility]  Physician Discharge Summary   Patient ID: Glenda Kim MRN: [id number] DOB/AGE: April 17, 2001 22 y.o.  Admit date: 11/30/2022 Discharge date: 12/03/2022    PCP: Arnette Felts, FNP  DISCHARGE DIAGNOSES:  Vasovagal syncope Gastroparesis   RECOMMENDATIONS FOR OUTPATIENT FOLLOW UP: Patient instructed to follow-up with her primary care provider as well as her regular gastroenterologist Check TSH in 3 to 4 weeks.   Home Health: None Equipment/Devices: None  CODE STATUS: Full code  DISCHARGE CONDITION: fair  Diet recommendation: As before  INITIAL HISTORY: 22 y.o. 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  There was concern for seizure-like activity.  Patient was hospitalized for further management.     Consultants: None yet   Procedures: EEG did not show any seizure or epileptiform activity   HOSPITAL COURSE:   Syncopal episode Likely vasovagal.  No focal neurological deficits noted.  There was some concern for seizure type activity when she had her syncopal episode.  EEG is negative.  MRI brain is negative.  No further neurological workup indicated at this time.   Echocardiogram shows normal systolic function without any significant valvular abnormalities. Patient was told about the findings of her studies mentioned above.   Nausea and vomiting in the setting of recent GI surgery done at [facility]. She does have chronic abdominal pain with nausea and vomiting. She was treated conservatively.  Lipase level and LFTs were normal.  Abdominal film was unremarkable.  Due to persistent pain a CT scan was done which did not show any acute findings. She was given metoclopramide for gastroparesis.  Her symptoms improved.  She was also given Carafate and PPI. She  feels much better this morning.  Tolerated her breakfast and lunch.  Wants to go home.     Recent PICC Line Infection Postoperatively 2023 patient had PICC line for TPN, this was removed in April due to concern for bacteremia.  Apparently outpatient blood cultures at a different facility grew Staph epidermidis, and MRSA.  Blood cultures at this facility drawn on 4/15 have been negative.  There was some mention that patient was on linezolid.  Not listed on her reconcile home medication list.   Patient mentioned that she has completed course of linezolid.  She has been afebrile.     Hypokalemia Supplemented   Sinus bradycardia Not on any AV blocking agents.  TSH only mildly elevated at 5.7.  Not clinically significant.  Can be rechecked in a few weeks in the outpatient setting.   Normocytic anemia Drop in hemoglobin is dilutional.  No evidence of overt blood loss.     Obesity Estimated body mass index is 53.03 kg/m as calculated from the following:   Height as of this encounter: 5\' 7"  (1.702 m).   Weight as of this encounter: 153.6 kg.  Patient is stable.  Okay for discharge home today per patient request.  She has tolerated breakfast and lunch.  PERTINENT LABS:  The results of significant diagnostics from this hospitalization (including imaging, microbiology, ancillary and laboratory) are listed below for reference.     Labs:   Basic Metabolic Panel: Recent Labs  Lab 11/30/22 0944 11/30/22 0956 12/01/22 1327 12/02/22 0315 12/03/22 0442  NA 139 142 142 143 140  K 3.3* 3.6 3.5 3.1* 3.5  CL 106 105 108 109 108  CO2 22  --  22 26 25  GLUCOSE 110* 113* 97 94 85  BUN 6 3* 5* <5* <5*  CREATININE 0.67 0.60 0.77 0.77 0.74  CALCIUM 9.4  --  9.4 8.5* 8.6*  MG  --   --   --  1.8 1.9   Liver Function Tests: Recent Labs  Lab 11/30/22 0944 12/01/22 1327 12/02/22 0315 12/03/22 0442  AST 27 31 30  40  ALT 42 45* 40 54*  ALKPHOS 86 87 72 88  BILITOT 1.2 1.1 0.7 0.7  PROT 7.4  6.8 5.0* 5.1*  ALBUMIN 3.9 3.7 2.8* 2.7*   Recent Labs  Lab 12/01/22 1430 12/02/22 0315  LIPASE 19 20    CBC: Recent Labs  Lab 11/30/22 0944 11/30/22 0956 12/01/22 1327 12/02/22 0315 12/03/22 0442  WBC 10.6*  --  8.5 7.4 6.6  NEUTROABS 9.2*  --   --   --   --   HGB 13.0 14.3 14.0 11.1* 11.6*  HCT 40.2 42.0 42.2 34.0* 35.8*  MCV 97.3  --  97.5 95.5 97.0  PLT 215  --  195 174 172    CBG: Recent Labs  Lab 11/30/22 0943  GLUCAP 108*     IMAGING STUDIES CT ABDOMEN PELVIS W CONTRAST  Result Date: 12/02/2022 CLINICAL DATA:  Abdominal pain, acute, nonlocalized EXAM: CT ABDOMEN AND PELVIS WITH CONTRAST TECHNIQUE: Multidetector CT imaging of the abdomen and pelvis was performed using the standard protocol following bolus administration of intravenous contrast. RADIATION DOSE REDUCTION: This exam was performed according to the departmental dose-optimization program which includes automated exposure control, adjustment of the mA and/or kV according to patient size and/or use of iterative reconstruction technique. CONTRAST:  OMNIPAQUE IOHEXOL 350 MG/ML SOLN COMPARISON:  08/04/2022 FINDINGS: Lower chest: Small right and trace left pleural effusions are present. Scattered dependent ground-glass opacity within the visualized lung bases is nonspecific and may be infectious or inflammatory in nature. Borderline cardiomegaly has developed, new since prior examination. No pericardial effusion. Hepatobiliary: Moderate hepatic steatosis. No enhancing intrahepatic mass. Probable focal fatty hepatic infiltration adjacent to the falciform ligament and within segment 4 B centrally. No intra or extrahepatic biliary ductal dilation. Cholecystectomy has been performed. Pancreas: Unremarkable Spleen: Unremarkable Adrenals/Urinary Tract: Adrenal glands are unremarkable. Kidneys are normal, without renal calculi, focal lesion, or hydronephrosis. Bladder is unremarkable. Stomach/Bowel: Surgical changes of  gastric sleeve resection as well as distal gastrectomy with Roux-en-Y gastrojejunostomy are identified. The stomach, small bowel, and large bowel are otherwise unremarkable. No free intraperitoneal gas. Mild simple appearing free fluid within the pelvis, nonspecific in a patient of this age. Appendix normal. Vascular/Lymphatic: No significant vascular findings are present. No enlarged abdominal or pelvic lymph nodes. Reproductive: 4.7 cm simple cyst noted within the left adnexa. The pelvic organs are otherwise unremarkable. Other: No abdominal wall hernia Musculoskeletal: Mixed lytic and sclerotic circumscribed lesion within the right iliac spine appears unchanged from prior examination, possibly representing an interosseous hemangioma or focal fibrous dysplasia. No acute bone abnormality. Stable vertebral hemangioma within the L1 vertebral body. IMPRESSION: 1. No acute intra-abdominal pathology identified. No definite radiographic explanation for the patient's reported symptoms. 2. Small right and trace left pleural effusions. Scattered dependent ground-glass opacity within the visualized lung bases is nonspecific and may be infectious or inflammatory in nature. 3. Borderline cardiomegaly, new since prior examination. 4. Moderate hepatic steatosis. 5. Surgical changes of gastric sleeve resection as well as distal gastrectomy with Roux-en-Y gastrojejunostomy. No obstruction. 6. 4.7 cm simple appearing left ovarian cyst. No follow-up imaging recommended. Note: This recommendation  does not apply to premenarchal patients and to those with increased risk (genetic, family history, elevated tumor markers or other high-risk factors) of ovarian cancer. Reference: JACR 2020 Feb; 17(2):248-254 7. Mild simple appearing free fluid within the pelvis, nonspecific in a patient of this age. Electronically Signed   By: Helyn Numbers M.D.   On: 12/02/2022 20:05   ECHOCARDIOGRAM COMPLETE  Result Date: 12/02/2022    ECHOCARDIOGRAM  REPORT   Patient Name:   Glenda Kim Date of Exam: 12/02/2022 Medical Rec #:  [id number]      Height:       67.0 in Accession #:    [id number]     Weight:       338.6 lb Date of Birth:  2001/03/03      BSA:          2.529 m Patient Age:    22 years       BP:           128/87 mmHg Patient Gender: F              HR:           44 bpm. Exam Location:  Inpatient Procedure: 2D Echo, Cardiac Doppler and Color Doppler Indications:    Syncope R55  History:        Patient has no prior history of Echocardiogram examinations.                 Signs/Symptoms:Syncope; Risk Factors:Former Smoker.  Sonographer:    Dondra Prader RVT RCS Referring Phys: [id number] [facility]  Sonographer Comments: Patient is obese. Image acquisition challenging due to patient body habitus. IMPRESSIONS  1. Left ventricular ejection fraction, by estimation, is 60 to 65%. The left ventricle has normal function. The left ventricle has no regional wall motion abnormalities. Left ventricular diastolic parameters were normal. There is the interventricular septum is flattened in systole and diastole, consistent with right ventricular pressure and volume overload.  2. Right ventricular systolic function is normal. The right ventricular size is mildly enlarged. There is mildly elevated pulmonary artery systolic pressure. The estimated right ventricular systolic pressure is 39.6 mmHg.  3. The mitral valve is grossly normal. No evidence of mitral valve regurgitation. No evidence of mitral stenosis.  4. The aortic valve is tricuspid. Aortic valve regurgitation is not visualized. No aortic stenosis is present.  5. The inferior vena cava is dilated in size with <50% respiratory variability, suggesting right atrial pressure of 15 mmHg. FINDINGS  Left Ventricle: Left ventricular ejection fraction, by estimation, is 60 to 65%. The left ventricle has normal function. The left ventricle has no regional wall motion abnormalities. The left ventricular internal cavity size was  normal in size. There is  no left ventricular hypertrophy. The interventricular septum is flattened in systole and diastole, consistent with right ventricular pressure and volume overload. Left ventricular diastolic parameters were normal. Right Ventricle: The right ventricular size is mildly enlarged. No increase in right ventricular wall thickness. Right ventricular systolic function is normal. There is mildly elevated pulmonary artery systolic pressure. The tricuspid regurgitant velocity is 2.48 m/s, and with an assumed right atrial pressure of 15 mmHg, the estimated right ventricular systolic pressure is 39.6 mmHg. Left Atrium: Left atrial size was normal in size. Right Atrium: Right atrial size was normal in size. Pericardium: Trivial pericardial effusion is present. Mitral Valve: The mitral valve is grossly normal. No evidence of mitral valve regurgitation. No evidence of mitral valve stenosis. Tricuspid Valve: The  tricuspid valve is grossly normal. Tricuspid valve regurgitation is mild . No evidence of tricuspid stenosis. Aortic Valve: The aortic valve is tricuspid. Aortic valve regurgitation is not visualized. No aortic stenosis is present. Aortic valve mean gradient measures 3.0 mmHg. Aortic valve peak gradient measures 6.1 mmHg. Aortic valve area, by VTI measures 2.78 cm. Pulmonic Valve: The pulmonic valve was grossly normal. Pulmonic valve regurgitation is trivial. No evidence of pulmonic stenosis. Aorta: The aortic root is normal in size and structure. Venous: The inferior vena cava is dilated in size with less than 50% respiratory variability, suggesting right atrial pressure of 15 mmHg. IAS/Shunts: The atrial septum is grossly normal.  LEFT VENTRICLE PLAX 2D LVIDd:         4.10 cm   Diastology LVIDs:         2.70 cm   LV e' medial:   10.00 cm/s LV PW:         1.20 cm   LV E/e' medial: 11.2 LV IVS:        1.00 cm LVOT diam:     2.00 cm LV SV:         82 LV SV Index:   33 LVOT Area:     3.14 cm  RIGHT  VENTRICLE             IVC RV S prime:     16.00 cm/s  IVC diam: 2.50 cm TAPSE (M-mode): 2.6 cm LEFT ATRIUM             Index        RIGHT ATRIUM           Index LA diam:        3.80 cm 1.50 cm/m   RA Area:     19.20 cm LA Vol (A2C):   61.6 ml 24.35 ml/m  RA Volume:   52.90 ml  20.91 ml/m LA Vol (A4C):   55.8 ml 22.06 ml/m LA Biplane Vol: 65.1 ml 25.74 ml/m  AORTIC VALVE                    PULMONIC VALVE AV Area (Vmax):    2.89 cm     PV Vmax:       1.10 m/s AV Area (Vmean):   2.72 cm     PV Peak grad:  4.8 mmHg AV Area (VTI):     2.78 cm AV Vmax:           123.00 cm/s AV Vmean:          81.400 cm/s AV VTI:            0.296 m AV Peak Grad:      6.1 mmHg AV Mean Grad:      3.0 mmHg LVOT Vmax:         113.00 cm/s LVOT Vmean:        70.500 cm/s LVOT VTI:          0.262 m LVOT/AV VTI ratio: 0.89  AORTA Ao Root diam: 3.00 cm MITRAL VALVE                TRICUSPID VALVE MV Area (PHT): 3.02 cm     TR Peak grad:   24.6 mmHg MV Decel Time: 251 msec     TR Vmax:        248.00 cm/s MV E velocity: 112.00 cm/s  SHUNTS                             Systemic VTI:  0.26 m                             Systemic Diam: 2.00 cm Lennie Odor MD Electronically signed by Lennie Odor MD Signature Date/Time: 12/02/2022/3:24:47 PM    Final    MR BRAIN WO CONTRAST  Result Date: 12/01/2022 CLINICAL DATA:  Seizure, new-onset, no history of trauma EXAM: MRI HEAD WITHOUT CONTRAST TECHNIQUE: Multiplanar, multiecho pulse sequences of the brain and surrounding structures were obtained without intravenous contrast. COMPARISON:  None Available. FINDINGS: Brain: No acute infarction, hemorrhage, hydrocephalus, extra-axial collection or mass lesion. Partially empty sella. Vascular: Major arterial flow voids are maintained. Skull and upper cervical spine: Normal marrow signal. Sinuses/Orbits: Clear sinuses.  No acute orbital findings. Other: No mastoid effusions. IMPRESSION: No acute abnormality.  No explanation for  seizures. Electronically Signed   By: Feliberto Harts M.D.   On: 12/01/2022 20:51   DG Abd 1 View  Result Date: 12/01/2022 CLINICAL DATA:  Nausea, vomiting, abdominal pain. EXAM: ABDOMEN - 1 VIEW COMPARISON:  CT abdomen/pelvis 08/04/2022. FINDINGS: There is a nonobstructive bowel gas pattern. There is a no definite free intraperitoneal air, within the confines of supine technique. There is no gross organomegaly or abnormal soft tissue calcification. The bones are unremarkable. IMPRESSION: Unremarkable KUB. Electronically Signed   By: Lesia Hausen M.D.   On: 12/01/2022 14:22   EEG adult  Result Date: 11/30/2022 Charlsie Quest, MD     11/30/2022 10:02 PM Patient Name: Glenda Kim MRN: [id number] Epilepsy Attending: Charlsie Quest Referring Physician/Provider: Maryln Gottron, MD Date: 11/30/2022 Duration: 23.18 mins Patient history: 22yo F with syncope getting eeg to evaluate for seizure Level of alertness: Awake, asleep AEDs during EEG study: Technical aspects: This EEG study was done with scalp electrodes positioned according to the 10-20 International system of electrode placement. Electrical activity was reviewed with band pass filter of 1-70Hz , sensitivity of 7 uV/mm, display speed of 63mm/sec with a 60Hz  notched filter applied as appropriate. EEG data were recorded continuously and digitally stored.  Video monitoring was available and reviewed as appropriate. Description: The posterior dominant rhythm consists of 9 Hz activity of moderate voltage (25-35 uV) seen predominantly in posterior head regions, symmetric and reactive to eye opening and eye closing. Sleep was characterized by vertex waves, sleep spindles (12 to 14 Hz), maximal frontocentral region. Hyperventilation and photic stimulation were not performed.   IMPRESSION: This study is within normal limits. No seizures or epileptiform discharges were seen throughout the recording. A normal interictal EEG does not exclude the diagnosis of  epilepsy. Charlsie Quest   CT C-SPINE NO CHARGE  Result Date: 11/30/2022 CLINICAL DATA:  Neck trauma. EXAM: CT Cervical Spine with contrast TECHNIQUE: Multiplanar CT images of the cervical spine were reconstructed from contemporary CT of the Neck. RADIATION DOSE REDUCTION: This exam was performed according to the departmental dose-optimization program which includes automated exposure control, adjustment of the mA and/or kV according to patient size and/or use of iterative reconstruction technique. CONTRAST:  None additional COMPARISON:  None Available. FINDINGS: Alignment: Normal. Skull base and vertebrae: No acute fracture. No primary bone lesion or focal pathologic process. Soft tissues and spinal canal: No prevertebral fluid or swelling. No visible canal hematoma. Disc levels:  No  visible impingement Upper chest: Negative IMPRESSION: Negative neck CT. Electronically Signed   By: Tiburcio Pea M.D.   On: 11/30/2022 11:35   CT ANGIO HEAD NECK W WO CM  Result Date: 11/30/2022 CLINICAL DATA:  Syncope with weakness.  Slurred speech EXAM: CT ANGIOGRAPHY HEAD AND NECK WITH AND WITHOUT CONTRAST TECHNIQUE: Multidetector CT imaging of the head and neck was performed using the standard protocol during bolus administration of intravenous contrast. Multiplanar CT image reconstructions and MIPs were obtained to evaluate the vascular anatomy. Carotid stenosis measurements (when applicable) are obtained utilizing NASCET criteria, using the distal internal carotid diameter as the denominator. RADIATION DOSE REDUCTION: This exam was performed according to the departmental dose-optimization program which includes automated exposure control, adjustment of the mA and/or kV according to patient size and/or use of iterative reconstruction technique. CONTRAST:  75mL OMNIPAQUE IOHEXOL 350 MG/ML SOLN COMPARISON:  None Available. FINDINGS: CT HEAD FINDINGS Brain: Suboptimal gray-white differentiation due to low-dose technique.  No hemorrhage or detected infarct. No hydrocephalus or masslike finding Vascular: See below Skull: Unremarkable Sinuses/Orbits: Unremarkable Review of the MIP images confirms the above findings CTA NECK FINDINGS Aortic arch: Normal with 2 vessel branching Right carotid system: Vessels are smoothly contoured and widely patent Left carotid system: Vessels are smoothly contoured and widely patent Vertebral arteries: No proximal subclavian stenosis. The vertebral arteries are smoothly contoured and widely patent. Skeleton: Negative Other neck: Negative Upper chest: Negative. Review of the MIP images confirms the above findings CTA HEAD FINDINGS Anterior circulation: No significant stenosis, proximal occlusion, aneurysm, or vascular malformation. Mild irregularity of vessel lumen thick MIPS is likely technical. No segmental beading. Posterior circulation: No significant stenosis, proximal occlusion, aneurysm, or vascular malformation. Venous sinuses: As permitted by contrast timing, patent. Anatomic variants: None unusual Review of the MIP images confirms the above findings IMPRESSION: Negative CTA.  No explanation for symptoms. Electronically Signed   By: Tiburcio Pea M.D.   On: 11/30/2022 11:34   DG Chest Portable 1 View  Result Date: 11/30/2022 CLINICAL DATA:  Weakness EXAM: PORTABLE CHEST 1 VIEW COMPARISON:  X-ray 07/05/2022 FINDINGS: No consolidation, pneumothorax or effusion. No edema. Normal cardiopericardial silhouette. Overlapping cardiac leads. Film is under penetrated IMPRESSION: No acute cardiopulmonary disease. Electronically Signed   By: Karen Kays M.D.   On: 11/30/2022 10:26    DISCHARGE EXAMINATION: Vitals:   12/02/22 2354 12/03/22 0545 12/03/22 0723 12/03/22 1244  BP: 125/84 115/78 105/68 131/84  Pulse: (!) 53 (!) 50 (!) 49 (!) 51  Resp: 18 18 17 18   Temp: 98.7 F (37.1 C) 98.1 F (36.7 C) 98 F (36.7 C) 98.7 F (37.1 C)  TempSrc: Oral Oral Oral Oral  SpO2: 95% 96% 95% 97%  Weight:       Height:       General appearance: Awake alert.  In no distress Resp: Clear to auscultation bilaterally.  Normal effort Cardio: S1-S2 is normal regular.  No S3-S4.  No rubs murmurs or bruit GI: Abdomen is soft.  Nontender nondistended.  Bowel sounds are present normal.  No masses organomegaly   DISPOSITION: Home  Discharge Instructions     Call MD for:  difficulty breathing, headache or visual disturbances   Complete by: As directed    Call MD for:  extreme fatigue   Complete by: As directed    Call MD for:  persistant dizziness or light-headedness   Complete by: As directed    Call MD for:  persistant nausea and vomiting   Complete  by: As directed    Call MD for:  severe uncontrolled pain   Complete by: As directed    Call MD for:  temperature >100.4   Complete by: As directed    Diet general   Complete by: As directed    Discharge instructions   Complete by: As directed    Please take your medications as prescribed.  Please be sure to follow-up with your primary care provider within 1 week.  You were cared for by a hospitalist during your hospital stay. If you have any questions about your discharge medications or the care you received while you were in the hospital after you are discharged, you can call the unit and asked to speak with the hospitalist on call if the hospitalist that took care of you is not available. Once you are discharged, your primary care physician will handle any further medical issues. Please note that NO REFILLS for any discharge medications will be authorized once you are discharged, as it is imperative that you return to your primary care physician (or establish a relationship with a primary care physician if you do not have one) for your aftercare needs so that they can reassess your need for medications and monitor your lab values. If you do not have a primary care physician, you can call [phone] for a physician referral.   Increase activity slowly    Complete by: As directed          Allergies as of 12/03/2022       Reactions   Shellfish Allergy Anaphylaxis   Bee Pollen Swelling   Pollen General         Medication List     STOP taking these medications    acetaZOLAMIDE ER 500 MG capsule Commonly known as: DIAMOX   promethazine 25 MG tablet Commonly known as: PHENERGAN       TAKE these medications    doxylamine (Sleep) 25 MG tablet Commonly known as: UNISOM Take 25 mg by mouth at bedtime.   metoCLOPramide 5 MG tablet Commonly known as: REGLAN Take 1 tablet (5 mg total) by mouth 3 (three) times daily before meals.   pantoprazole 40 MG tablet Commonly known as: Protonix Take 1 tablet (40 mg total) by mouth daily.   sucralfate 1 GM/10ML suspension Commonly known as: CARAFATE Take 10 mLs (1 g total) by mouth 4 (four) times daily -  with meals and at bedtime for 10 days.          Follow-up Information     Arnette Felts, FNP. Schedule an appointment as soon as possible for a visit in 1 week(s).   Specialty: General Practice Why: post hospitalization follow up Contact information: [address] [place] [address] [phone]                 TOTAL DISCHARGE TIME:  Osvaldo Shipper  [facility] Pager on [email or web address]  12/04/2022, 12:40 PM

## 2022-12-03 NOTE — Progress Notes (Signed)
Discharge instructions given, patient ready for discharge

## 2022-12-04 ENCOUNTER — Telehealth: Payer: Self-pay

## 2022-12-04 NOTE — Transitions of Care (Post Inpatient/ED Visit) (Unsigned)
   12/04/2022  Name: Glenda Kim MRN: [id number] DOB: 04-09-2001  Today's TOC FU Call Status: Today's TOC FU Call Status:: Unsuccessul Call (1st Attempt) Unsuccessful Call (1st Attempt) Date: 12/04/22  Attempted to reach the patient regarding the most recent Inpatient/ED visit.  Follow Up Plan: Additional outreach attempts will be made to reach the patient to complete the Transitions of Care (Post Inpatient/ED visit) call.   Signature   Woodfin Ganja LPN [facility] Nurse Health Advisor Direct Dial [phone]

## 2022-12-05 NOTE — Transitions of Care (Post Inpatient/ED Visit) (Unsigned)
   12/05/2022  Name: Glenda Kim MRN: [id number] DOB: Jun 14, 2001  Today's TOC FU Call Status: Today's TOC FU Call Status:: Unsuccessful Call (2nd Attempt) Unsuccessful Call (1st Attempt) Date: 12/04/22 Unsuccessful Call (2nd Attempt) Date: 12/05/22  Attempted to reach the patient regarding the most recent Inpatient/ED visit.  Follow Up Plan: Additional outreach attempts will be made to reach the patient to complete the Transitions of Care (Post Inpatient/ED visit) call.   Signature   Woodfin Ganja LPN [facility] Nurse Health Advisor Direct Dial [phone]

## 2022-12-06 NOTE — Transitions of Care (Post Inpatient/ED Visit) (Signed)
   12/06/2022  Name: Rebeca Valdivia MRN: [id number] DOB: December 17, 2000  Today's TOC FU Call Status: Today's TOC FU Call Status:: Successful TOC FU Call Competed Unsuccessful Call (1st Attempt) Date: 12/04/22 Unsuccessful Call (2nd Attempt) Date: 12/05/22 Unsuccessful Call (3rd Attempt) Date: 12/06/22 [facility] FU Call Complete Date: 12/06/22  Attempted to reach the patient regarding the most recent Inpatient/ED visit.  Follow Up Plan: No further outreach attempts will be made at this time. We have been unable to contact the patient.  Signature   Woodfin Ganja LPN [facility] Nurse Health Advisor Direct Dial [phone]

## 2023-01-23 IMAGING — RF DG SPINAL PUNCT LUMBAR DIAG WITH FL CT GUIDANCE
1 series · 1 of 1 positions shown · non-contrast
Comparison: None

CLINICAL DATA: Patient with history of intractable cluster headache
syndrome, labyrinthine vertigo with involvement of right inner ear,
and pseudotumor cerebri. Request for diagnostic lumbar puncture.

EXAM:
DIAGNOSTIC LUMBAR PUNCTURE UNDER FLUOROSCOPIC GUIDANCE

[Series 1: cp_bariatric · 0.17mm/px · 1 of 1 slices shown]
[im 1/1]
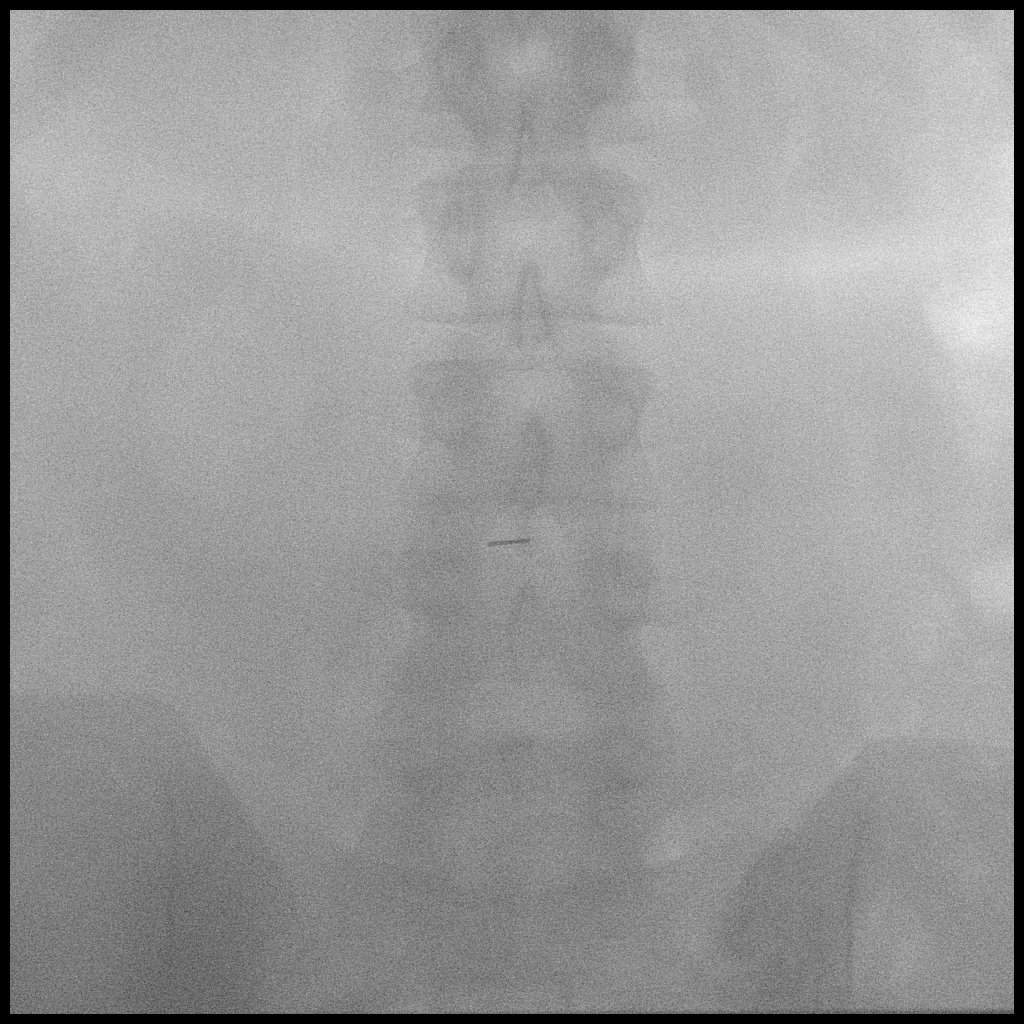

[1 of 1 positions shown; findings below may reference images not displayed]

FLUOROSCOPY:
Radiation Exposure Index (if provided by the fluoroscopic device):
11.0 mGy

PROCEDURE:
Informed consent was obtained from the patient prior to the
procedure, including potential complications of headache, allergy,
and pain. With the patient prone, the lower back was prepped with
Betadine. 1% Lidocaine was used for local anesthesia. Lumbar
puncture was performed at the L3-L4 level using a 22 gauge needle
with return of clear CSF with an opening pressure of 41 cm water. No
closing pressure obtained. 13 ml of CSF were obtained for laboratory
studies. The patient tolerated the procedure well and there were no
apparent complications.
IMPRESSION: Technically successful fluoroscopic guided lumbar puncture.

## 2023-01-24 ENCOUNTER — Institutional Professional Consult (permissible substitution): Payer: No Typology Code available for payment source | Admitting: Neurology

## 2023-02-08 ENCOUNTER — Inpatient Hospital Stay (HOSPITAL_BASED_OUTPATIENT_CLINIC_OR_DEPARTMENT_OTHER)
Admission: EM | Admit: 2023-02-08 | Discharge: 2023-02-12 | DRG: 392 | Disposition: A | Discharge status: Home / Self Care | Payer: Medicaid Other | Attending: Internal Medicine | Admitting: Internal Medicine

## 2023-02-08 ENCOUNTER — Other Ambulatory Visit: Payer: Self-pay

## 2023-02-08 ENCOUNTER — Encounter (HOSPITAL_BASED_OUTPATIENT_CLINIC_OR_DEPARTMENT_OTHER): Payer: Self-pay

## 2023-02-08 DIAGNOSIS — G8929 Other chronic pain: Secondary | ICD-10-CM | POA: Diagnosis present

## 2023-02-08 DIAGNOSIS — Z9049 Acquired absence of other specified parts of digestive tract: Secondary | ICD-10-CM

## 2023-02-08 DIAGNOSIS — E876 Hypokalemia: Secondary | ICD-10-CM | POA: Diagnosis present

## 2023-02-08 DIAGNOSIS — K219 Gastro-esophageal reflux disease without esophagitis: Secondary | ICD-10-CM | POA: Diagnosis present

## 2023-02-08 DIAGNOSIS — R059 Cough, unspecified: Secondary | ICD-10-CM | POA: Diagnosis present

## 2023-02-08 DIAGNOSIS — F419 Anxiety disorder, unspecified: Secondary | ICD-10-CM | POA: Diagnosis present

## 2023-02-08 DIAGNOSIS — Z6841 Body Mass Index (BMI) 40.0 and over, adult: Secondary | ICD-10-CM

## 2023-02-08 DIAGNOSIS — Z8249 Family history of ischemic heart disease and other diseases of the circulatory system: Secondary | ICD-10-CM

## 2023-02-08 DIAGNOSIS — R569 Unspecified convulsions: Secondary | ICD-10-CM | POA: Diagnosis present

## 2023-02-08 DIAGNOSIS — R55 Syncope and collapse: Secondary | ICD-10-CM | POA: Diagnosis present

## 2023-02-08 DIAGNOSIS — Z91013 Allergy to seafood: Secondary | ICD-10-CM

## 2023-02-08 DIAGNOSIS — Z9884 Bariatric surgery status: Secondary | ICD-10-CM

## 2023-02-08 DIAGNOSIS — Z1152 Encounter for screening for COVID-19: Secondary | ICD-10-CM

## 2023-02-08 DIAGNOSIS — R109 Unspecified abdominal pain: Principal | ICD-10-CM | POA: Diagnosis present

## 2023-02-08 DIAGNOSIS — Z91030 Bee allergy status: Secondary | ICD-10-CM

## 2023-02-08 DIAGNOSIS — R112 Nausea with vomiting, unspecified: Secondary | ICD-10-CM | POA: Diagnosis present

## 2023-02-08 DIAGNOSIS — R410 Disorientation, unspecified: Secondary | ICD-10-CM | POA: Diagnosis present

## 2023-02-08 DIAGNOSIS — F1290 Cannabis use, unspecified, uncomplicated: Secondary | ICD-10-CM | POA: Diagnosis present

## 2023-02-08 DIAGNOSIS — Z79899 Other long term (current) drug therapy: Secondary | ICD-10-CM

## 2023-02-08 DIAGNOSIS — R519 Headache, unspecified: Secondary | ICD-10-CM | POA: Diagnosis present

## 2023-02-08 DIAGNOSIS — Z87828 Personal history of other (healed) physical injury and trauma: Secondary | ICD-10-CM

## 2023-02-08 DIAGNOSIS — Z818 Family history of other mental and behavioral disorders: Secondary | ICD-10-CM

## 2023-02-08 DIAGNOSIS — F32A Depression, unspecified: Secondary | ICD-10-CM | POA: Diagnosis present

## 2023-02-08 DIAGNOSIS — Z87891 Personal history of nicotine dependence: Secondary | ICD-10-CM

## 2023-02-08 LAB — LIPASE, BLOOD: Lipase: <10 U/L — ABNORMAL LOW (ref 11–51)

## 2023-02-08 LAB — CBC WITH DIFFERENTIAL/PLATELET
Abs Immature Granulocytes: 0.03 10*3/uL (ref 0.00–0.07)
Basophils Absolute: 0.1 10*3/uL (ref 0.0–0.1)
Basophils Relative: 1 %
Eosinophils Absolute: 0.1 10*3/uL (ref 0.0–0.5)
Eosinophils Relative: 1 %
HCT: 39.9 % (ref 36.0–46.0)
Hemoglobin: 13.3 g/dL (ref 12.0–15.0)
Immature Granulocytes: 0 %
Lymphocytes Relative: 15 %
Lymphs Abs: 1.5 10*3/uL (ref 0.7–4.0)
MCH: 31.4 pg (ref 26.0–34.0)
MCHC: 33.3 g/dL (ref 30.0–36.0)
MCV: 94.3 fL (ref 80.0–100.0)
Monocytes Absolute: 0.8 10*3/uL (ref 0.1–1.0)
Monocytes Relative: 8 %
Neutro Abs: 7.4 10*3/uL (ref 1.7–7.7)
Neutrophils Relative %: 75 %
Platelets: 244 10*3/uL (ref 150–400)
RBC: 4.23 MIL/uL (ref 3.87–5.11)
RDW: 14.3 % (ref 11.5–15.5)
WBC: 9.8 10*3/uL (ref 4.0–10.5)
nRBC: 0 % (ref 0.0–0.2)

## 2023-02-08 LAB — COMPREHENSIVE METABOLIC PANEL
ALT: 21 U/L (ref 0–44)
AST: 15 U/L (ref 15–41)
Albumin: 4.1 g/dL (ref 3.5–5.0)
Alkaline Phosphatase: 106 U/L (ref 38–126)
Anion gap: 10 (ref 5–15)
BUN: 6 mg/dL (ref 6–20)
CO2: 23 mmol/L (ref 22–32)
Calcium: 9.7 mg/dL (ref 8.9–10.3)
Chloride: 107 mmol/L (ref 98–111)
Creatinine, Ser: 0.59 mg/dL (ref 0.44–1.00)
GFR, Estimated: >60 mL/min (ref >60)
Glucose, Bld: 113 mg/dL — ABNORMAL HIGH (ref 70–99)
Potassium: 3.7 mmol/L (ref 3.5–5.1)
Sodium: 140 mmol/L (ref 135–145)
Total Bilirubin: 0.8 mg/dL (ref 0.3–1.2)
Total Protein: 7 g/dL (ref 6.5–8.1)

## 2023-02-08 LAB — HCG, QUANTITATIVE, PREGNANCY: hCG, Beta Chain, Quant, S: <1 m[IU]/mL (ref <5)

## 2023-02-08 LAB — CBG MONITORING, ED: Glucose-Capillary: 110 mg/dL — ABNORMAL HIGH (ref 70–99)

## 2023-02-08 LAB — ETHANOL: Alcohol, Ethyl (B): <10 mg/dL (ref <10)

## 2023-02-08 MED ORDER — SODIUM CHLORIDE 0.9 % IV BOLUS
1000.0000 mL | Freq: Once | INTRAVENOUS | Status: AC
  Administered 2023-02-08: 1000 mL via INTRAVENOUS

## 2023-02-08 MED ORDER — LORAZEPAM 2 MG/ML IJ SOLN
INTRAMUSCULAR | Status: AC
  Administered 2023-02-08: 2 mg
  Filled 2023-02-08: qty 1

## 2023-02-08 MED ORDER — ONDANSETRON HCL 4 MG/2ML IJ SOLN
4.0000 mg | Freq: Once | INTRAMUSCULAR | Status: AC
  Administered 2023-02-08: 4 mg via INTRAVENOUS
  Filled 2023-02-08: qty 2

## 2023-02-08 NOTE — ED Notes (Signed)
ED Provider at bedside. 

## 2023-02-08 NOTE — ED Provider Notes (Signed)
[place] EMERGENCY DEPARTMENT AT [facility] Provider Note   CSN: [id number] Arrival date & time: 02/08/23  2242     History  Chief Complaint  Patient presents with   Loss of Consciousness   Emesis    Glenda Kim is a 22 y.o. adult.  Patient is a 22 year old female with past medical history of morbid obesity status post gastric bypass, gastroparesis/cyclic vomiting, and recent admission for seizure-like activity.  According to the patient's roommate, the patient was at work this evening when she began vomiting.  She returned home with continued vomiting that was tinged with blood.  Patient presenting here for abdominal pain and vomiting, then experienced some sort of seizure-like activity in the waiting room.  She apparently had generalized shaking, slurred speech, and was moaning and shaking.  Patient was recently admitted after a similar episode.  She underwent MRI, EEG, and full neurologic workup, however no definitive abnormality was found.  The history is provided by the patient.       Home Medications Prior to Admission medications   Medication Sig Start Date End Date Taking? Authorizing Provider  doxylamine, Sleep, (UNISOM) 25 MG tablet Take 25 mg by mouth at bedtime. 06/27/22 08/10/22  [provider]  metoCLOPramide (REGLAN) 5 MG tablet Take 1 tablet (5 mg total) by mouth 3 (three) times daily before meals. 12/03/22 02/01/23  Osvaldo Shipper, MD  pantoprazole (PROTONIX) 40 MG tablet Take 1 tablet (40 mg total) by mouth daily. 12/03/22 02/01/23  Osvaldo Shipper, MD  sucralfate (CARAFATE) 1 GM/10ML suspension Take 10 mLs (1 g total) by mouth 4 (four) times daily -  with meals and at bedtime for 10 days. 12/03/22 12/13/22  Osvaldo Shipper, MD      Allergies    Shellfish allergy and Bee pollen    Review of Systems   Review of Systems  All other systems reviewed and are negative.   Physical Exam Updated Vital Signs BP 116/86   Pulse 100   Temp 99.1 F  (37.3 C) (Oral)   Resp 10   Ht 5\' 7"  (1.702 m)   Wt (!) 145.2 kg   SpO2 97%   BMI 50.12 kg/m  Physical Exam Vitals and nursing note reviewed.  Constitutional:      General: Glenda Kim is not in acute distress.    Appearance: Glenda Kim is well-developed. Glenda Kim is not diaphoretic.     Comments: Patient is awake, but is writhing, moaning, and thrashing about.  HENT:     Head: Normocephalic and atraumatic.  Eyes:     Extraocular Movements: Extraocular movements intact.     Pupils: Pupils are equal, round, and reactive to light.  Cardiovascular:     Rate and Rhythm: Normal rate and regular rhythm.     Heart sounds: No murmur heard.    No friction rub. No gallop.  Pulmonary:     Effort: Pulmonary effort is normal. No respiratory distress.     Breath sounds: Normal breath sounds. No wheezing.  Abdominal:     General: Bowel sounds are normal. There is no distension.     Palpations: Abdomen is soft.     Tenderness: There is abdominal tenderness. There is no right CVA tenderness, left CVA tenderness, guarding or rebound.     Comments: There is generalized abdominal tenderness.  Musculoskeletal:        General: Normal range of motion.     Cervical back: Normal range of motion and neck supple.  Skin:    General: Skin  is warm and dry.  Neurological:     General: No focal deficit present.     Mental Status: Glenda Kim is oriented to person, place, and time.     Comments: Patient is awake, but writhing, moaning, and thrashing.  She does move all extremities.  Neurologic exam is limited secondary to current neurologic episode, but appears intact otherwise.     ED Results / Procedures / Treatments   Labs (all labs ordered are listed, but only abnormal results are displayed) Labs Reviewed  CBG MONITORING, ED - Abnormal; Notable for the following components:      Result Value   Glucose-Capillary 110 (*)    All other components within normal limits    EKG EKG Interpretation Date/Time:  Thursday  February 08 2023 22:57:08 EDT Ventricular Rate:  89 PR Interval:  147 QRS Duration:  79 QT Interval:  367 QTC Calculation: 447 R Axis:   23  Text Interpretation: Sinus rhythm Confirmed by Linwood Dibbles [phone]) on 02/08/2023 10:59:16 PM  Radiology No results found.  Procedures Procedures    Medications Ordered in ED Medications - No data to display  ED Course/ Medical Decision Making/ A&P  Patient is a 22 year old female with history of gastric bypass, cyclic vomiting, gastroparesis, obesity, headaches.  Patient presenting with complaints of nausea and vomiting and after having experienced some sort of possible seizure activity.  Patient arrives here writhing, moaning, and exhibiting occasional tonic-clonic type movements.  Workup initiated including CBC, CMP, lipase, and pregnancy test.  All studies basically unremarkable.  EtOH is negative.  CT scan of the head as well as CT scan of the abdomen and pelvis obtained, neither showing an acute abnormality.  The patient received 2 mg of IV Ativan following the questionable seizure episode.  She was also given morphine and Toradol for pain along with Zofran for nausea.  She has been observed for multiple hours in the ER and continues to be somnolent and stating that her pain is no better.  At this point, I feel as though she will require admission for management of her intractable abdominal pain.  I did discuss care with Dr. Derry Lory from neurology.  If patient is being admitted, he feels as though a continuous EEG may be of benefit to rule out seizure activity.  I have also spoken with the hospitalist who agrees to admit.  Final Clinical Impression(s) / ED Diagnoses Final diagnoses:  None    Rx / DC Orders ED Discharge Orders     None         Geoffery Lyons, MD 02/09/23 [id number]

## 2023-02-08 NOTE — ED Triage Notes (Signed)
POV from home with roommate  Prior to triage, pt passed out in lobby, head down and responsive to painful stimuli, pt then had 30 seconds of seizure, responsive now to verbal stimuli but very confused, slurred speech and unable to answer questions.   Roommate reports vomiting at home, vomit looked like it had blood in it.

## 2023-02-09 ENCOUNTER — Observation Stay (HOSPITAL_COMMUNITY): Payer: Medicaid Other

## 2023-02-09 ENCOUNTER — Emergency Department (HOSPITAL_BASED_OUTPATIENT_CLINIC_OR_DEPARTMENT_OTHER): Payer: Medicaid Other

## 2023-02-09 ENCOUNTER — Encounter (HOSPITAL_COMMUNITY): Payer: Self-pay | Admitting: Internal Medicine

## 2023-02-09 ENCOUNTER — Other Ambulatory Visit (HOSPITAL_BASED_OUTPATIENT_CLINIC_OR_DEPARTMENT_OTHER): Payer: Medicaid Other

## 2023-02-09 DIAGNOSIS — R059 Cough, unspecified: Secondary | ICD-10-CM | POA: Diagnosis not present

## 2023-02-09 DIAGNOSIS — R519 Headache, unspecified: Secondary | ICD-10-CM

## 2023-02-09 DIAGNOSIS — Z91030 Bee allergy status: Secondary | ICD-10-CM | POA: Diagnosis not present

## 2023-02-09 DIAGNOSIS — R569 Unspecified convulsions: Secondary | ICD-10-CM

## 2023-02-09 DIAGNOSIS — Z79899 Other long term (current) drug therapy: Secondary | ICD-10-CM | POA: Diagnosis not present

## 2023-02-09 DIAGNOSIS — R109 Unspecified abdominal pain: Secondary | ICD-10-CM | POA: Diagnosis not present

## 2023-02-09 DIAGNOSIS — R051 Acute cough: Secondary | ICD-10-CM

## 2023-02-09 DIAGNOSIS — Z9884 Bariatric surgery status: Secondary | ICD-10-CM | POA: Diagnosis not present

## 2023-02-09 DIAGNOSIS — Z818 Family history of other mental and behavioral disorders: Secondary | ICD-10-CM | POA: Diagnosis not present

## 2023-02-09 DIAGNOSIS — Z1152 Encounter for screening for COVID-19: Secondary | ICD-10-CM | POA: Diagnosis not present

## 2023-02-09 DIAGNOSIS — Z6841 Body Mass Index (BMI) 40.0 and over, adult: Secondary | ICD-10-CM | POA: Diagnosis not present

## 2023-02-09 DIAGNOSIS — Z91013 Allergy to seafood: Secondary | ICD-10-CM | POA: Diagnosis not present

## 2023-02-09 DIAGNOSIS — E876 Hypokalemia: Secondary | ICD-10-CM | POA: Diagnosis not present

## 2023-02-09 DIAGNOSIS — Z87891 Personal history of nicotine dependence: Secondary | ICD-10-CM | POA: Diagnosis not present

## 2023-02-09 DIAGNOSIS — R55 Syncope and collapse: Secondary | ICD-10-CM | POA: Diagnosis not present

## 2023-02-09 DIAGNOSIS — Z8249 Family history of ischemic heart disease and other diseases of the circulatory system: Secondary | ICD-10-CM | POA: Diagnosis not present

## 2023-02-09 DIAGNOSIS — G8929 Other chronic pain: Secondary | ICD-10-CM

## 2023-02-09 DIAGNOSIS — F32A Depression, unspecified: Secondary | ICD-10-CM | POA: Diagnosis not present

## 2023-02-09 DIAGNOSIS — Z9049 Acquired absence of other specified parts of digestive tract: Secondary | ICD-10-CM | POA: Diagnosis not present

## 2023-02-09 DIAGNOSIS — F1290 Cannabis use, unspecified, uncomplicated: Secondary | ICD-10-CM

## 2023-02-09 LAB — RESPIRATORY PANEL BY PCR

## 2023-02-09 LAB — HEMOGLOBIN AND HEMATOCRIT, BLOOD
HCT: 35.2 % — ABNORMAL LOW (ref 36.0–46.0)
Hemoglobin: 11.5 g/dL — ABNORMAL LOW (ref 12.0–15.0)

## 2023-02-09 MED ORDER — MORPHINE SULFATE (PF) 4 MG/ML IV SOLN
4.0000 mg | Freq: Once | INTRAVENOUS | Status: AC
  Administered 2023-02-09: 4 mg via INTRAVENOUS
  Filled 2023-02-09: qty 1

## 2023-02-09 MED ORDER — ONDANSETRON HCL 4 MG/2ML IJ SOLN
4.0000 mg | Freq: Once | INTRAMUSCULAR | Status: AC
  Administered 2023-02-09: 4 mg via INTRAVENOUS
  Filled 2023-02-09: qty 2

## 2023-02-09 MED ORDER — KETOROLAC TROMETHAMINE 30 MG/ML IJ SOLN
30.0000 mg | Freq: Four times a day (QID) | INTRAMUSCULAR | Status: DC | PRN
  Administered 2023-02-10 – 2023-02-11 (×3): 30 mg via INTRAVENOUS
  Filled 2023-02-09 (×3): qty 1

## 2023-02-09 MED ORDER — PANTOPRAZOLE SODIUM 40 MG IV SOLR
40.0000 mg | INTRAVENOUS | Status: DC
  Administered 2023-02-09 – 2023-02-10 (×2): 40 mg via INTRAVENOUS
  Filled 2023-02-09 (×2): qty 10

## 2023-02-09 MED ORDER — ONDANSETRON HCL 4 MG PO TABS: 4.0000 mg | ORAL_TABLET | Freq: Four times a day (QID) | ORAL | Status: DC | PRN

## 2023-02-09 MED ORDER — SODIUM CHLORIDE 0.9 % IV SOLN: INTRAVENOUS | Status: DC

## 2023-02-09 MED ORDER — IOHEXOL 300 MG/ML  SOLN
100.0000 mL | Freq: Once | INTRAMUSCULAR | Status: AC | PRN
  Administered 2023-02-09: 100 mL via INTRAVENOUS

## 2023-02-09 MED ORDER — ACETAMINOPHEN 325 MG PO TABS
650.0000 mg | ORAL_TABLET | Freq: Four times a day (QID) | ORAL | Status: DC | PRN
  Filled 2023-02-09: qty 2

## 2023-02-09 MED ORDER — KETOROLAC TROMETHAMINE 30 MG/ML IJ SOLN
30.0000 mg | Freq: Once | INTRAMUSCULAR | Status: AC
  Administered 2023-02-09: 30 mg via INTRAVENOUS
  Filled 2023-02-09: qty 1

## 2023-02-09 MED ORDER — ALBUTEROL SULFATE (2.5 MG/3ML) 0.083% IN NEBU: 2.5000 mg | INHALATION_SOLUTION | Freq: Four times a day (QID) | RESPIRATORY_TRACT | Status: DC | PRN

## 2023-02-09 MED ORDER — DIPHENHYDRAMINE HCL 50 MG/ML IJ SOLN
25.0000 mg | Freq: Once | INTRAMUSCULAR | Status: AC
  Administered 2023-02-09: 25 mg via INTRAVENOUS
  Filled 2023-02-09: qty 1

## 2023-02-09 MED ORDER — PROCHLORPERAZINE EDISYLATE 10 MG/2ML IJ SOLN
10.0000 mg | Freq: Once | INTRAMUSCULAR | Status: AC
  Administered 2023-02-09: 10 mg via INTRAVENOUS
  Filled 2023-02-09: qty 2

## 2023-02-09 MED ORDER — ONDANSETRON HCL 4 MG/2ML IJ SOLN
4.0000 mg | Freq: Four times a day (QID) | INTRAMUSCULAR | Status: DC | PRN
  Administered 2023-02-09 – 2023-02-10 (×2): 4 mg via INTRAVENOUS
  Filled 2023-02-09 (×2): qty 2

## 2023-02-09 MED ORDER — SODIUM CHLORIDE 0.9% FLUSH
10.0000 mL | Freq: Two times a day (BID) | INTRAVENOUS | Status: DC
  Administered 2023-02-10 – 2023-02-11 (×4): 10 mL

## 2023-02-09 MED ORDER — SODIUM CHLORIDE 0.9% FLUSH
3.0000 mL | Freq: Two times a day (BID) | INTRAVENOUS | Status: DC
  Administered 2023-02-09 – 2023-02-12 (×6): 3 mL via INTRAVENOUS

## 2023-02-09 MED ORDER — ACETAMINOPHEN 650 MG RE SUPP: 650.0000 mg | Freq: Four times a day (QID) | RECTAL | Status: DC | PRN

## 2023-02-09 MED ORDER — BOOST / RESOURCE BREEZE PO LIQD CUSTOM
1.0000 | Freq: Three times a day (TID) | ORAL | Status: DC
  Administered 2023-02-10 – 2023-02-12 (×4): 1 via ORAL

## 2023-02-09 MED ORDER — ENOXAPARIN SODIUM 80 MG/0.8ML IJ SOSY
70.0000 mg | PREFILLED_SYRINGE | INTRAMUSCULAR | Status: DC
  Administered 2023-02-09 – 2023-02-12 (×4): 70 mg via SUBCUTANEOUS
  Filled 2023-02-09 (×4): qty 0.8

## 2023-02-09 MED ORDER — SODIUM CHLORIDE 0.9% FLUSH
10.0000 mL | INTRAVENOUS | Status: DC | PRN
  Administered 2023-02-12: 10 mL

## 2023-02-09 MED ORDER — MORPHINE SULFATE (PF) 4 MG/ML IV SOLN
4.0000 mg | INTRAVENOUS | Status: DC | PRN
  Administered 2023-02-09 (×2): 4 mg via INTRAVENOUS
  Filled 2023-02-09 (×2): qty 1

## 2023-02-09 NOTE — Plan of Care (Signed)

## 2023-02-09 NOTE — Progress Notes (Addendum)
Plan of Care Note for accepted transfer  Patient: Glenda Kim              MVH:[id number]  DOA: 02/08/2023     Facility requesting transfer: [facility] emergency department Requesting Provider: Dimitri Ped, MD  Reason for transfer: Intractable abdominal pain and concern for questionable seizure like activity.  Facility course:  22 y.o. 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  Patient was found unresponsive by her roommate and brought to ED for evaluation. While patient in the ED waiting room has a questionable seizure-like activity with foaming around her mouth.  At ED, patient found initially tachycardic 116, respiratory rate 18, blood pressure 149/92% 98% room air.  Patient is hemodynamically stable.  EKG showed sinus rhythm heart rate 89 without any ST and T wave abnormality.  CBC and CMP grossly unremarkable. Lipase negative. Blood ethanol level less than 10.  CT head unremarkable finding CT abdomen pelvis no acute abdominal process and hepatic steatosis.  In the ED patient has been given Toradol 30 mg once, morphine 4 mg once and Zofran 4 mg once for management of nausea and intractable abdominal pain.  There is some questionable seizure-like episode as well as patient has intractable abdominal pain for which Dr. Judd Lien is concerned and requested for hospital admission.  I have suggested Dr. Judd Lien to consult and reach out on-call neurology for recommendation by the meantime before patient coming to the [facility] or [facility].   Plan of care: Continue to monitor for improvement of nausea and abdominal pain.  Will follow-up with neurology care plan.  Please reach out to neurology unless there is already neurology consult note with  recommendation on the patient's chart.  The patient is accepted for admission to Med-surg  unit, at [facility] or [facility].   Check [email or web address] for on-call coverage.  TRH will assume care on arrival to accepting facility. Until arrival, medical decision making responsibilities remain with the EDP.  However, TRH available 24/7 for questions and assistance.   Nursing staff please page [facility] Admits and Consults [phone]) as soon as the patient arrives to the hospital.    Author: Tereasa Coop, MD  02/09/2023  [facility]

## 2023-02-09 NOTE — ED Notes (Signed)
Patient awakened by [facility], pt began dry heaving, complains of nausea and pain 7/10. Delo, MD gave verbal orders

## 2023-02-09 NOTE — Progress Notes (Signed)
Received the patient and they were able to transfer to the unite bed alone. Vitals done and they look normal. Handed over to the day team for continuity of care plus admission.

## 2023-02-09 NOTE — H&P (Signed)
History and Physical    Patient: Glenda Kim GEX:[id number] DOB: [birth year] DOA: 02/08/2023 DOS: the patient was seen and examined on 02/09/2023 PCP: Arnette Felts, FNP  Patient coming from: Transfer from [facility]   Chief Complaint:  Chief Complaint  Patient presents with   Loss of Consciousness   Emesis   HPI: Glenda Kim is a 22 y.o. adult with medical history significant of cyclical vomiting syndrome, gastroparesis, endometriosis, anxiety, depression, GERD, marijuana use, and morbid obesity s/p biliopancreatic diversion with duodenal switch who presented with complaints of nausea and vomiting starting yesterday afternoon while she was a work.  She reported emesis had chunks of blood present in coffee-ground appearance.  Noted associated symptoms of feeling really drained, hot flashes, and has had a cough.  She makes note that symptoms were different than prior episodes in the past.  Ultimately she was told to go home. After getting home she passed out at the frontdoor. Her roommate was there and tried to catch her but both ended up falling and patient did hit her head.  It was reported that she was out for approximately 30 seconds before coming to.  She was able to get take a shower, but subsequently reported to have continued vomiting and passed out again but did not sustain any significant trauma to her head.  She does not drink alcohol, but does admit to smoking THC intermittently.  She just recently been hospitalized on 6/6 after having syncopal episode with reports of seizure-like activity for which she was worked up and had negative EEG with normal MRI of the brain and echocardiogram.  Prior to that patient had  biliopancreatic diversion with duodenal switch which she was on TPN until sometime in April.  She had been on PPI and Carafate but stopped those after coming off of TPN.  Patient reports that she did want to be on reglan as it made her have cramps.  In the emergency  department patient was noted to be afebrile, pulse 59-1 16, respirations 10-22, blood pressures elevated up to 151/89, and O2 saturation maintained on room air.  In triage patient was reported to have  another episode of syncope with reports of seizure-like activity that lasted a few seconds while in the ED.  Labs from 8/15 were relatively unremarkable including lipase.  CT scan of the abdomen pelvis noted no acute process and hepatic steatosis.  CT scan of the head did not note any acute abnormality.  Patient had been given 1 L of normal saline IV fluids, morphine, Zofran, and ketorolac.  Review of Systems: As mentioned in the history of present illness. All other systems reviewed and are negative. Past Medical History:  Diagnosis Date   Anxiety    Cyclic vomiting syndrome    Depression    Endometriosis    Gastroparesis    GERD (gastroesophageal reflux disease)    Morbid obesity (HCC)    Past Surgical History:  Procedure Laterality Date   CHOLECYSTECTOMY     Social History:  reports that [name] has quit smoking. Riley's smoking use included e-cigarettes. [name] has never used smokeless tobacco. [name] reports current alcohol use. [name] reports that [name] does not currently use drugs.  Allergies  Allergen Reactions   Shellfish Allergy Anaphylaxis   Bee Pollen Swelling    Pollen General       Family History  Problem Relation Age of Onset   Depression Mother    Anxiety disorder Mother    Heart disease Father    Cancer  Father    Healthy Sister    Depression Brother    Anxiety disorder Brother    Irritable bowel syndrome Maternal Grandmother    Crohn's disease Maternal Grandmother    Cancer Maternal Grandfather    Hypertension Paternal Grandmother     Prior to Admission medications   Medication Sig Start Date End Date Taking? Authorizing Provider  doxylamine, Sleep, (UNISOM) 25 MG tablet Take 25 mg by mouth at bedtime. 06/27/22 08/10/22  [provider]  metoCLOPramide  (REGLAN) 5 MG tablet Take 1 tablet (5 mg total) by mouth 3 (three) times daily before meals. 12/03/22 02/01/23  Osvaldo Shipper, MD  pantoprazole (PROTONIX) 40 MG tablet Take 1 tablet (40 mg total) by mouth daily. 12/03/22 02/01/23  Osvaldo Shipper, MD  sucralfate (CARAFATE) 1 GM/10ML suspension Take 10 mLs (1 g total) by mouth 4 (four) times daily -  with meals and at bedtime for 10 days. 12/03/22 12/13/22  Osvaldo Shipper, MD    Physical Exam: Vitals:   02/09/23 0530 02/09/23 0600 02/09/23 0630 02/09/23 0712  BP: 134/70 127/70 119/85 (!) 148/93  Pulse: 67 69 69 (!) 57  Resp: 19 15 19 14   Temp:  97.9 F (36.6 C)  98.2 F (36.8 C)  TempSrc:  Oral  Oral  SpO2: 98% 97% 100% 100%  Weight:      Height:        Constitutional: Young morbidly obese female who appears ill but in no acute distress Eyes: PERRL, lids and conjunctivae normal ENMT: Mucous membranes are dry. Neck: normal, supple  Respiratory: clear to auscultation bilaterally, no wheezing, no crackles. Normal respiratory effort. No accessory muscle use.  Cardiovascular: Regular rate and rhythm, no murmurs / rubs / gallops. No extremity edema.   Abdomen: no tenderness, no masses palpated.  Bowel sounds positive.  Musculoskeletal: no clubbing / cyanosis. No joint deformity upper and lower extremities. Good ROM, no contractures. Normal muscle tone.  Skin: no rashes, lesions, ulcers. No induration Neurologic: CN 2-12 grossly intact. Sensation intact, DTR normal. Strength 5/5 in all 4.  Psychiatric: Normal judgment and insight. Alert and oriented x 3. Normal mood.   Data Reviewed:  EKG revealed sinus rhythm at 89 bpm.  Reviewed labs, imaging, and pertinent records as documented in this note.  Assessment and Plan:   Intractable nausea and vomiting Possible hematemesis Acute.  Patient presents with reports of nausea and vomiting for which she has not been able to keep any significant amount of food or liquids down since yesterday.  Labs  noted stable hemoglobin at 13.3 g/dL with stable vital signs.  Lipase and LFTs within normal limits.  CT scan of the abdomen and pelvis noted no acute intra-abdominal process with hepatic steatosis.  Patient reports prior cyclical vomiting and gastroparesis as possible causes for symptoms in the past.  On differential includes gastroenteritis and cannabinoid hyperemesis syndrome. -Admit to a medical telemetry bed -Aspiration precautions with elevation head of bed -Monitor intake and output -Clear liquid diet advance diet as tolerated -Check urinalysis -Recheck H&H -Antiemetics as needed -Protonix IV  Cough Patient reports having intermittent cough and just recently started and had been tested with negative COVID and influenza screen.  No significant wheezing appreciated on physical exam. -Check respiratory virus panel -Check chest x-ray  Syncope Seizure-like activity Patient reported to have multiple syncopal episodes prior to arrival thought secondary to vasovagal or orthostatic hypotension.  Seizure-like activity jerking while in the ED after syncopal episode.  CT scan of the head negative  for any acute abnormality.  Admitted for episode with reports of seizure-like activity in June of this year with negative workup including MRI brain with EEG.  Patient was not started on any seizure medication.  Suspect likely convulsive syncope as patient did not seem to have any postictal period. -Seizure precautions and monitor for seizure activity -Check orthostatic vital signs  Headaches Acute on chronic.  CT scan of the brain did not note any acute abnormality. -Give Compazine and Benadryl  Morbid obesity S/p biliopancreatic diversion with duodenal switch BMI 50.12 kg/m.  Patient had biliopancreatic diversion with duodenal switch procedure done previously at [facility] 06/24/2022.  Marijuana use Patient reports intermittent marijuana use.  This could be a possible cause for her intermittent cyclical  vomiting. -Follow-up UDS  DVT  prophylaxis: Lovenox Advance Care Planning:   Code Status: Full Code   Consults:   Family Communication: Roommate updated at bedside  Severity of Illness: The appropriate patient status for this patient is OBSERVATION. Observation status is judged to be reasonable and necessary in order to provide the required intensity of service to ensure the patient's safety. The patient's presenting symptoms, physical exam findings, and initial radiographic and laboratory data in the context of their medical condition is felt to place them at decreased risk for further clinical deterioration. Furthermore, it is anticipated that the patient will be medically stable for discharge from the hospital within 2 midnights of admission.   Author: Clydie Braun, MD 02/09/2023 9:49 AM  For on call review [email or web address].

## 2023-02-09 NOTE — ED Notes (Signed)
Patient transported to CT 

## 2023-02-09 NOTE — Progress Notes (Signed)
   02/09/23 1958  Orthostatic Lying   Pulse- Lying 50  BP- Lying 97/58  Orthostatic Sitting  Pulse- Sitting 55  BP- Sitting 113/81  Orthostatic Standing at 0 minutes  BP- Standing at 0 minutes 108/73  Pulse- Standing at 0 minutes 58  Orthostatic Standing at 3 minutes  Pulse- Standing at 3 minutes 77  BP- Standing at 3 minutes 108/79   One time order orthostatic vitals. Patient tolerated without any dizziness or other issues.

## 2023-02-09 NOTE — Progress Notes (Signed)
Transition of Care [facility]) - Inpatient Brief Assessment   Patient Details  Name: Glenda Kim MRN: [id number] Date of Birth: 22-Jun-2001  [facility] of [facility] CM/SW Contact:    Janae Bridgeman, RN Phone Number: 02/09/2023, 1:44 PM   Clinical Narrative: Patient admitted to the hospital from home with abdominal pain and history of  laparoscopic biliopancreatic diversion with duodenal switch on 06/24/2022.  No TOC needs at this time.  Transition of Care Asessment: Insurance and Status: (P) Insurance coverage has been reviewed Patient has primary care physician: (P) Yes Home environment has been reviewed: (P) Yes - from home Prior level of function:: (P) Independent Prior/Current Home Services: (P) No current home services Social Determinants of Health Reivew: (P) SDOH reviewed no interventions necessary Readmission risk has been reviewed: (P) Yes Transition of care needs: (P) no transition of care needs at this time

## 2023-02-09 NOTE — ED Notes (Signed)
Assumed care of patient from charge RN. Pt VSS, resting, MD to speak with about admission. Family at bedside. Per previous RN, EDP does not need urine sample

## 2023-02-10 ENCOUNTER — Observation Stay (HOSPITAL_COMMUNITY): Payer: Medicaid Other

## 2023-02-10 ENCOUNTER — Inpatient Hospital Stay (HOSPITAL_COMMUNITY): Payer: Medicaid Other

## 2023-02-10 DIAGNOSIS — Z9884 Bariatric surgery status: Secondary | ICD-10-CM | POA: Diagnosis not present

## 2023-02-10 DIAGNOSIS — Z91013 Allergy to seafood: Secondary | ICD-10-CM | POA: Diagnosis not present

## 2023-02-10 DIAGNOSIS — R569 Unspecified convulsions: Secondary | ICD-10-CM | POA: Diagnosis not present

## 2023-02-10 DIAGNOSIS — F445 Conversion disorder with seizures or convulsions: Secondary | ICD-10-CM | POA: Diagnosis not present

## 2023-02-10 DIAGNOSIS — Z818 Family history of other mental and behavioral disorders: Secondary | ICD-10-CM | POA: Diagnosis not present

## 2023-02-10 DIAGNOSIS — Z87828 Personal history of other (healed) physical injury and trauma: Secondary | ICD-10-CM | POA: Diagnosis not present

## 2023-02-10 DIAGNOSIS — Z9049 Acquired absence of other specified parts of digestive tract: Secondary | ICD-10-CM | POA: Diagnosis not present

## 2023-02-10 DIAGNOSIS — R051 Acute cough: Secondary | ICD-10-CM | POA: Diagnosis not present

## 2023-02-10 DIAGNOSIS — E876 Hypokalemia: Secondary | ICD-10-CM | POA: Diagnosis present

## 2023-02-10 DIAGNOSIS — R519 Headache, unspecified: Secondary | ICD-10-CM | POA: Diagnosis present

## 2023-02-10 DIAGNOSIS — Z79899 Other long term (current) drug therapy: Secondary | ICD-10-CM | POA: Diagnosis not present

## 2023-02-10 DIAGNOSIS — R109 Unspecified abdominal pain: Secondary | ICD-10-CM | POA: Diagnosis not present

## 2023-02-10 DIAGNOSIS — F32A Depression, unspecified: Secondary | ICD-10-CM | POA: Diagnosis present

## 2023-02-10 DIAGNOSIS — R410 Disorientation, unspecified: Secondary | ICD-10-CM | POA: Diagnosis present

## 2023-02-10 DIAGNOSIS — F419 Anxiety disorder, unspecified: Secondary | ICD-10-CM | POA: Diagnosis present

## 2023-02-10 DIAGNOSIS — F1290 Cannabis use, unspecified, uncomplicated: Secondary | ICD-10-CM | POA: Diagnosis present

## 2023-02-10 DIAGNOSIS — R112 Nausea with vomiting, unspecified: Secondary | ICD-10-CM | POA: Diagnosis present

## 2023-02-10 DIAGNOSIS — Z91030 Bee allergy status: Secondary | ICD-10-CM | POA: Diagnosis not present

## 2023-02-10 DIAGNOSIS — Z87891 Personal history of nicotine dependence: Secondary | ICD-10-CM | POA: Diagnosis not present

## 2023-02-10 DIAGNOSIS — Z6841 Body Mass Index (BMI) 40.0 and over, adult: Secondary | ICD-10-CM | POA: Diagnosis not present

## 2023-02-10 DIAGNOSIS — R059 Cough, unspecified: Secondary | ICD-10-CM | POA: Diagnosis present

## 2023-02-10 DIAGNOSIS — R55 Syncope and collapse: Secondary | ICD-10-CM | POA: Diagnosis present

## 2023-02-10 DIAGNOSIS — Z1152 Encounter for screening for COVID-19: Secondary | ICD-10-CM | POA: Diagnosis not present

## 2023-02-10 DIAGNOSIS — Z8249 Family history of ischemic heart disease and other diseases of the circulatory system: Secondary | ICD-10-CM | POA: Diagnosis not present

## 2023-02-10 LAB — CBC
HCT: 34.5 % — ABNORMAL LOW (ref 36.0–46.0)
Hemoglobin: 11.2 g/dL — ABNORMAL LOW (ref 12.0–15.0)
MCH: 31.8 pg (ref 26.0–34.0)
MCHC: 32.5 g/dL (ref 30.0–36.0)
MCV: 98 fL (ref 80.0–100.0)
Platelets: 196 10*3/uL (ref 150–400)
RBC: 3.52 MIL/uL — ABNORMAL LOW (ref 3.87–5.11)
RDW: 14.7 % (ref 11.5–15.5)
WBC: 8 10*3/uL (ref 4.0–10.5)
nRBC: 0 % (ref 0.0–0.2)

## 2023-02-10 LAB — RAPID URINE DRUG SCREEN, HOSP PERFORMED
Amphetamines: NOT DETECTED
Barbiturates: NOT DETECTED
Benzodiazepines: POSITIVE — AB
Cocaine: NOT DETECTED
Opiates: POSITIVE — AB
Tetrahydrocannabinol: POSITIVE — AB

## 2023-02-10 LAB — BASIC METABOLIC PANEL
Anion gap: 10 (ref 5–15)
BUN: <5 mg/dL — ABNORMAL LOW (ref 6–20)
CO2: 25 mmol/L (ref 22–32)
Calcium: 8.5 mg/dL — ABNORMAL LOW (ref 8.9–10.3)
Chloride: 104 mmol/L (ref 98–111)
Creatinine, Ser: 0.5 mg/dL (ref 0.44–1.00)
GFR, Estimated: >60 mL/min (ref >60)
Glucose, Bld: 73 mg/dL (ref 70–99)
Potassium: 3.1 mmol/L — ABNORMAL LOW (ref 3.5–5.1)
Sodium: 139 mmol/L (ref 135–145)

## 2023-02-10 LAB — MAGNESIUM: Magnesium: 1.8 mg/dL (ref 1.7–2.4)

## 2023-02-10 MED ORDER — POTASSIUM CHLORIDE 10 MEQ/100ML IV SOLN
INTRAVENOUS | Status: AC
  Administered 2023-02-10: 10 meq via INTRAVENOUS
  Filled 2023-02-10: qty 100

## 2023-02-10 MED ORDER — GADOBUTROL 1 MMOL/ML IV SOLN
10.0000 mL | Freq: Once | INTRAVENOUS | Status: AC | PRN
  Administered 2023-02-10: 10 mL via INTRAVENOUS

## 2023-02-10 MED ORDER — POTASSIUM CHLORIDE 10 MEQ/100ML IV SOLN
10.0000 meq | INTRAVENOUS | Status: AC
  Administered 2023-02-10 (×2): 10 meq via INTRAVENOUS
  Filled 2023-02-10 (×2): qty 100

## 2023-02-10 NOTE — Plan of Care (Signed)

## 2023-02-10 NOTE — Progress Notes (Signed)
Per Neuro, to be done after MRI. Pt is in MRI and not available currently.

## 2023-02-10 NOTE — Progress Notes (Signed)
LTM EEG hooked up and running - no initial skin breakdown - push button tested - [facility] monitoring.  

## 2023-02-10 NOTE — Consult Note (Signed)
Neurology Consultation  Reason for Consult: seizure-like activity Referring Physician: Dr. Mariane Masters  CC: Seizure-like activity  History is obtained from: Patient and chart  HPI: Glenda Kim is a 22 y.o. 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  Patient states that she does not recall anything of this episode just that she felt dizzy beforehand and knows that she bit the inside of her cheek during the spell.  While in triage, she was noted to have another possibly syncopal episode with seizure-like activity for a few seconds.  In June, she also presented to the hospital with nausea and vomiting and had a similar episode where she passed out and had possible seizure activity.  Patient reports that she has never had a seizure before June but that when she was a child she had episodes of staring off to space which sound somewhat like absence seizures.  No members of her family have had seizures.  She states that she may have had a concussion when she was a teenager but did not get treatment for it.  She has never had meningitis or encephalitis.  She did have some developmental delays and learning disabilities as a child.  Patient also reports that she has been having severe, 10 out of 10 sharp headaches located on the top and left side of her head with photophobia and phonophobia.  She has seen a neurologist in the past for idiopathic intracranial hypertension and was on Diamox at 1 point, but was able to discontinue this medication after she had lost some weight.    ROS: A complete ROS was performed and is negative except as noted in the HPI.   Past Medical History:  Diagnosis Date   Anxiety    Cyclic vomiting syndrome     Depression    Endometriosis    Gastroparesis    GERD (gastroesophageal reflux disease)    Morbid obesity (HCC)      Family History  Problem Relation Age of Onset   Depression Mother    Anxiety disorder Mother    Heart disease Father    Cancer Father    Healthy Sister    Depression Brother    Anxiety disorder Brother    Irritable bowel syndrome Maternal Grandmother    Crohn's disease Maternal Grandmother    Cancer Maternal Grandfather    Hypertension Paternal Grandmother      Social History:   reports that she has quit smoking. Her smoking use included e-cigarettes. She has never used smokeless tobacco. She reports that she does not currently use alcohol. She reports that she does not currently use drugs.  Medications  Current Facility-Administered Medications:    0.9 %  sodium chloride infusion, , Intravenous, Continuous, Katrinka Blazing, Rondell A, MD, Last Rate: 75 mL/hr at 02/10/23 1133, New Bag at 02/10/23 1133   acetaminophen (TYLENOL) tablet 650 mg, 650 mg, Oral, Q6H PRN **OR** acetaminophen (TYLENOL) suppository 650 mg, 650 mg, Rectal, Q6H PRN, Smith, Rondell A, MD   albuterol (PROVENTIL) (2.5 MG/3ML) 0.083% nebulizer solution 2.5 mg, 2.5 mg, Nebulization, Q6H PRN, Smith, Rondell A, MD   enoxaparin (LOVENOX) injection 70 mg, 70 mg, Subcutaneous, Q24H, Smith, Rondell A, MD, 70 mg at 02/10/23 1100   feeding supplement (BOOST / RESOURCE BREEZE) liquid 1 Container, 1 Container, Oral, TID BM,  Clydie Braun, MD, 1 Container at 02/10/23 1106   ketorolac (TORADOL) 30 MG/ML injection 30 mg, 30 mg, Intravenous, Q6H PRN, Katrinka Blazing, Rondell A, MD, 30 mg at 02/10/23 1111   morphine (PF) 4 MG/ML injection 4 mg, 4 mg, Intravenous, Q4H PRN, Katrinka Blazing, Rondell A, MD, 4 mg at 02/09/23 1926   ondansetron (ZOFRAN) tablet 4 mg, 4 mg, Oral, Q6H PRN **OR** ondansetron (ZOFRAN) injection 4 mg, 4 mg, Intravenous, Q6H PRN, Katrinka Blazing, Rondell A, MD, 4 mg at 02/09/23 1057   pantoprazole (PROTONIX) injection 40 mg, 40  mg, Intravenous, Q24H, Smith, Rondell A, MD, 40 mg at 02/10/23 1111   potassium chloride 10 mEq in 100 mL IVPB, 10 mEq, Intravenous, Q1 Hr x 4, Kadali, Renuka A, MD   sodium chloride flush (NS) 0.9 % injection 10-40 mL, 10-40 mL, Intracatheter, Q12H, Smith, Rondell A, MD, 10 mL at 02/10/23 0900   sodium chloride flush (NS) 0.9 % injection 10-40 mL, 10-40 mL, Intracatheter, PRN, Katrinka Blazing, Rondell A, MD   sodium chloride flush (NS) 0.9 % injection 3 mL, 3 mL, Intravenous, Q12H, Smith, Rondell A, MD, 3 mL at 02/10/23 0900   Exam: Current vital signs: BP 124/69 (BP Location: Left Arm)   Pulse (!) 49   Temp 97.7 F (36.5 C) (Oral)   Resp 18   Ht 5\' 7"  (1.702 m)   Wt (!) 145.2 kg   SpO2 96%   BMI 50.12 kg/m  Vital signs in last 24 hours: Temp:  [97.7 F (36.5 C)-98.4 F (36.9 C)] 97.7 F (36.5 C) (08/17 0750) Pulse Rate:  [49-53] 49 (08/17 1230) Resp:  [18] 18 (08/17 0550) BP: (105-124)/(58-77) 124/69 (08/17 1230) SpO2:  [96 %-100 %] 96 % (08/17 1230)  GENERAL: Awake, alert, in no acute distress Psych: Affect appropriate for situation, patient is calm and cooperative with examination Head: Normocephalic and atraumatic, without obvious abnormality EENT: Normal conjunctivae, moist mucous membranes, no OP obstruction LUNGS: Normal respiratory effort. Non-labored breathing on room air CV: Regular rate and rhythm on telemetry  Extremities: warm, well perfused, without obvious deformity  NEURO:  Mental Status: Awake, alert, and oriented to person, place, time, and situation. She is able to provide a clear and coherent history of present illness. Speech/Language: speech is clear and fluent.   Naming, repetition, fluency, and comprehension intact without aphasia  No neglect is noted Cranial Nerves:  II: PERRL III, IV, VI: EOMI. Lid elevation symmetric and full.  V: Sensation is intact to light touch and symmetrical to face.  VII: Face is symmetric resting and smiling.  VIII: Hearing  intact to voice IX, X: Phonation normal.  XI: Normal sternocleidomastoid and trapezius muscle strength XII: Tongue protrudes midline without fasciculations.   Motor: 5/5 strength is all muscle groups.  Tone is normal. Bulk is normal.  Sensation: Intact to light touch bilaterally in all four extremities.  Coordination: FTN intact bilaterally. No pronator drift.  Gait: Deferred  Labs I have reviewed labs in epic and the results pertinent to this consultation are:  CBC    Component Value Date/Time   WBC 8.0 02/10/2023 0330   RBC 3.52 (L) 02/10/2023 0330   HGB 11.2 (L) 02/10/2023 0330   HGB 13.1 02/02/2022 1726   HCT 34.5 (L) 02/10/2023 0330   HCT 39.9 02/02/2022 1726   PLT 196 02/10/2023 0330   PLT 294 02/02/2022 1726   MCV 98.0 02/10/2023 0330   MCV 91 02/02/2022 1726   MCH 31.8 02/10/2023 0330   MCHC 32.5 02/10/2023  0330   RDW 14.7 02/10/2023 0330   RDW 13.1 02/02/2022 1726   LYMPHSABS 1.5 02/08/2023 2309   MONOABS 0.8 02/08/2023 2309   EOSABS 0.1 02/08/2023 2309   BASOSABS 0.1 02/08/2023 2309    CMP     Component Value Date/Time   NA 139 02/10/2023 0330   NA 134 02/02/2022 1726   K 3.1 (L) 02/10/2023 0330   CL 104 02/10/2023 0330   CO2 25 02/10/2023 0330   GLUCOSE 73 02/10/2023 0330   BUN <5 (L) 02/10/2023 0330   BUN 14 02/02/2022 1726   CREATININE 0.50 02/10/2023 0330   CALCIUM 8.5 (L) 02/10/2023 0330   PROT 7.0 02/08/2023 2309   PROT 7.1 02/02/2022 1726   ALBUMIN 4.1 02/08/2023 2309   ALBUMIN 4.3 02/02/2022 1726   AST 15 02/08/2023 2309   ALT 21 02/08/2023 2309   ALKPHOS 106 02/08/2023 2309   BILITOT 0.8 02/08/2023 2309   BILITOT 0.3 02/02/2022 1726   GFRNONAA >60 02/10/2023 0330    Lipid Panel     Component Value Date/Time   CHOL 160 02/02/2022 1726   TRIG 129 02/02/2022 1726   HDL 47 02/02/2022 1726   CHOLHDL 3.4 02/02/2022 1726   LDLCALC 90 02/02/2022 1726   UDS pending, previous UDS in June was negative  Imaging I have reviewed the images  obtained:  CT-scan of the brain: No acute abnormalities  MRI examination of the brain: Pending  Assessment: 22 year old patient with history of cyclical vomiting syndrome, gastroparesis, endometriosis, anxiety, depression, GERD, marijuana use and 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  Patient has no memory of this and did bite the inside of her mouth during the episode.  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  Will hold off on starting AEDs until results of LTM RN.  Also will obtain MRI brain with and without contrast given possible seizure activity and severe headaches in young patient.  Impression: Seizure-like activity, awaiting LTM EEG read  Recommendations: -Long-term EEG for spell characterization -MRI brain with and without contrast -Will hold off on starting AEDs until long-term EEG results are seen  Discussed [facility] statutes, patients with seizures are not allowed to drive until they have been seizure-free for six months Use caution when using heavy equipment or power tools. Avoid working on ladders or at heights. Take showers instead of baths. Ensure the water temperature is not too high on the home water heater. Do not go swimming alone. Do not lock yourself in a room alone (i.e. bathroom). When caring for infants or small children, sit down when holding, feeding, or changing them to minimize risk of injury to the child in the event you have a seizure. Maintain good sleep hygiene. Avoid alcohol.    Pt seen by NP/Neuro and later by MD. Note/plan to be edited by MD as needed.  Cortney E Ernestina Columbia , MSN, AGACNP-BC [facility] See Amion for schedule and pager information 02/10/2023 1:47 PM      Attending Neurohospitalist Addendum Patient seen and examined with APP/Resident. Agree with the history and physical as documented above. Agree with the plan as documented, which I helped formulate. I have edited the note above to reflect my full findings and recommendations. I have independently  reviewed the chart, obtained history, review of systems and examined the patient.I have personally reviewed pertinent head/neck/spine imaging (CT/MRI). Please feel free to call with any questions.  -- Bing Neighbors, MD [facility] [phone]  If 7pm- 7am, please page neurology on call as listed in AMION.

## 2023-02-10 NOTE — Progress Notes (Signed)
Progress Note   Patient: Glenda Kim JWJ:[id number] DOB: September 29, 2000 DOA: 02/08/2023     0 DOS: the patient was seen and examined on 02/10/2023   Brief hospital course: 22 y.o. adult with medical history significant for obesity, cyclic vomiting syndrome, gastroparesis, laparoscopic biliopancreatic diversion with duodenal switch on 06/24/2022 presented to emergency department at [facility] with complaining of intractable abdominal pain.  Patient was found unresponsive by her roommate and brought to ED for evaluation. While patient in the ED waiting room has a questionable seizure-like activity with foaming around her mouth.  -Workup in the ED is negative.  -Admitted for seizure-like activity and improvement of intractable nausea and abdominal pain.   Assessment and Plan:  Abdominal pain, intractable nausea and vomiting, possible hematemesis: differential includes gastroenteritis and cannabinoid hyperemesis syndrome Lipase and LFTs within normal limits.   CT scan of the abdomen and pelvis noted no acute intra-abdominal process with hepatic steatosis. Patient reports prior cyclical vomiting and gastroparesis as possible causes for symptoms in the past.  -Admit to a medical telemetry bed Clear liquid diet advance diet as tolerated Hemoglobin slightly dropped to 11.2 this a.m. of CBC in a.m. Continue antiemetics as needed Continue Protonix IV   Hypokalemia likely secondary to GI losses: Give potassium supplement by IV, recheck levels in a.m. Check magnesium level  Cough: Respiratory virus panel negative Reviewed Chest x-ray- No acute cardiopulmonary process    Syncope, Seizure-like activity Patient reported to have multiple syncopal episodes prior to arrival thought secondary to vasovagal or orthostatic hypotension.  Seizure-like activity jerking while in the ED after syncopal episode.   CT scan of the head negative for any acute abnormality.  Admitted for episode with  reports of seizure-like activity in June of this year with negative workup including MRI brain with EEG.  Patient was not started on any seizure medication.  Suspect likely convulsive syncope as patient did not seem to have any postictal period. - Will consult neurology -Seizure precautions and monitor for seizure activity  Headaches Acute on chronic.   CT scan of the brain did not note any acute abnormality. Compazine and Benadryl   Morbid obesity S/p biliopancreatic diversion with duodenal switch BMI 50.12 kg/m.  Patient had biliopancreatic diversion with duodenal switch procedure done previously at [facility] 06/24/2022.   Marijuana use Patient reports intermittent marijuana use.  This could be a possible cause for her intermittent cyclical vomiting. Follow-up UDS        Subjective: Patient denies any nausea, vomiting, abdominal pain today States that she is able to tolerate her clear liquid diet Denies any new seizures Denies fever, chills, shortness of breath, chest pain, headache, dizziness, constipation  Physical Exam: Vitals:   02/09/23 2020 02/10/23 0020 02/10/23 0550 02/10/23 0750  BP: 106/65 (!) 113/58 119/77 110/69  Pulse: (!) 52 (!) 50 (!) 51 (!) 51  Resp: 18 18 18    Temp: 98.3 F (36.8 C) 98.4 F (36.9 C) 98.2 F (36.8 C) 97.7 F (36.5 C)  TempSrc:    Oral  SpO2: 98% 98% 98% 97%  Weight:      Height:       Physical Exam Constitutional:      General: She is not in acute distress.    Appearance: Normal appearance. She is obese.  HENT:     Head: Normocephalic and atraumatic.     Nose: Nose normal. No congestion or rhinorrhea.     Mouth/Throat:     Mouth: Mucous membranes are moist.  Pharynx: Oropharynx is clear. No oropharyngeal exudate.  Eyes:     Extraocular Movements: Extraocular movements intact.     Conjunctiva/sclera: Conjunctivae normal.     Pupils: Pupils are equal, round, and reactive to light.  Cardiovascular:     Rate and Rhythm: Normal rate  and regular rhythm.     Pulses: Normal pulses.     Heart sounds: Normal heart sounds.  Pulmonary:     Effort: Pulmonary effort is normal.     Breath sounds: Normal breath sounds.  Musculoskeletal:        General: No swelling or tenderness. Normal range of motion.     Cervical back: Normal range of motion and neck supple.     Right lower leg: No edema.     Left lower leg: No edema.  Skin:    General: Skin is warm and dry.     Capillary Refill: Capillary refill takes 2 to 3 seconds.  Neurological:     General: No focal deficit present.     Mental Status: She is alert and oriented to person, place, and time.     Cranial Nerves: No cranial nerve deficit.     Sensory: No sensory deficit.     Motor: No weakness.  Psychiatric:        Mood and Affect: Mood normal.        Behavior: Behavior normal.     Data Reviewed:  Latest Reference Range & Units 02/10/23 03:30  Sodium 135 - 145 mmol/L 139  Potassium 3.5 - 5.1 mmol/L 3.1 (L)  Chloride 98 - 111 mmol/L 104  CO2 22 - 32 mmol/L 25  Glucose 70 - 99 mg/dL 73  BUN 6 - 20 mg/dL <5 (L)  Creatinine 5.28 - 1.00 mg/dL 4.13  Calcium 8.9 - 24.4 mg/dL 8.5 (L)  Anion gap 5 - 15  10  GFR, Estimated >60 mL/min >60  WBC 4.0 - 10.5 K/uL 8.0  RBC 3.87 - 5.11 MIL/uL 3.52 (L)  Hemoglobin 12.0 - 15.0 g/dL 01.0 (L)  HCT 27.2 - 53.6 % 34.5 (L)  MCV 80.0 - 100.0 fL 98.0  MCH 26.0 - 34.0 pg 31.8  MCHC 30.0 - 36.0 g/dL 64.4  RDW 03.4 - 74.2 % 14.7  Platelets 150 - 400 K/uL 196  nRBC 0.0 - 0.2 % 0.0  (L): Data is abnormally low  Family Communication:   Disposition: Status is: Observation   Planned Discharge Destination: Home    Time spent: 35 minutes  Author: Ernestene Mention, MD 02/10/2023 12:21 PM  For on call review [email or web address].

## 2023-02-11 DIAGNOSIS — F445 Conversion disorder with seizures or convulsions: Secondary | ICD-10-CM

## 2023-02-11 DIAGNOSIS — R569 Unspecified convulsions: Secondary | ICD-10-CM | POA: Diagnosis not present

## 2023-02-11 LAB — BASIC METABOLIC PANEL
Anion gap: 9 (ref 5–15)
BUN: 5 mg/dL — ABNORMAL LOW (ref 6–20)
CO2: 22 mmol/L (ref 22–32)
Calcium: 8.3 mg/dL — ABNORMAL LOW (ref 8.9–10.3)
Chloride: 107 mmol/L (ref 98–111)
Creatinine, Ser: 0.66 mg/dL (ref 0.44–1.00)
GFR, Estimated: >60 mL/min (ref >60)
Glucose, Bld: 74 mg/dL (ref 70–99)
Potassium: 3.5 mmol/L (ref 3.5–5.1)
Sodium: 138 mmol/L (ref 135–145)

## 2023-02-11 LAB — CBC
HCT: 34.1 % — ABNORMAL LOW (ref 36.0–46.0)
Hemoglobin: 11.1 g/dL — ABNORMAL LOW (ref 12.0–15.0)
MCH: 32 pg (ref 26.0–34.0)
MCHC: 32.6 g/dL (ref 30.0–36.0)
MCV: 98.3 fL (ref 80.0–100.0)
Platelets: 185 10*3/uL (ref 150–400)
RBC: 3.47 MIL/uL — ABNORMAL LOW (ref 3.87–5.11)
RDW: 14.3 % (ref 11.5–15.5)
WBC: 6.9 10*3/uL (ref 4.0–10.5)
nRBC: 0 % (ref 0.0–0.2)

## 2023-02-11 LAB — MAGNESIUM: Magnesium: 1.6 mg/dL — ABNORMAL LOW (ref 1.7–2.4)

## 2023-02-11 MED ORDER — MAGNESIUM SULFATE 4 GM/100ML IV SOLN
4.0000 g | Freq: Once | INTRAVENOUS | Status: AC
  Administered 2023-02-11: 4 g via INTRAVENOUS
  Filled 2023-02-11: qty 100

## 2023-02-11 MED ORDER — LORAZEPAM 2 MG/ML IJ SOLN: 2.0000 mg | INTRAMUSCULAR | Status: DC | PRN

## 2023-02-11 MED ORDER — PANTOPRAZOLE SODIUM 40 MG PO TBEC
40.0000 mg | DELAYED_RELEASE_TABLET | Freq: Every day | ORAL | Status: DC
  Administered 2023-02-11 – 2023-02-12 (×2): 40 mg via ORAL
  Filled 2023-02-11 (×2): qty 1

## 2023-02-11 NOTE — Procedures (Signed)
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 EEG to evaluate for seizure  Level of alertness: Awake, asleep  AEDs during EEG study: None  Technical aspects: This EEG study was done with scalp electrodes positioned according to the 10-20 International system of electrode placement. Electrical activity was reviewed with band pass filter of 1-70Hz , sensitivity of 7 uV/mm, display speed of 65mm/sec with a 60Hz  notched filter applied as appropriate. EEG data were recorded continuously and digitally stored.  Video monitoring was available and reviewed as appropriate.  Description: The posterior dominant rhythm consists of 8-9Hz  activity of moderate voltage (25-35 uV) seen predominantly in posterior head regions, symmetric and reactive to eye opening and eye closing. Sleep was characterized by vertex waves, sleep spindles (12 to 14 Hz), maximal frontocentral region.  Hyperventilation and photic stimulation were not performed.     One event was recorded on 02/11/2023 at 1147.  Patient was laying in bed, eyes were, not responding to staff.  On passively opening her eyes, staff also noted that her eyes were rolled back.  Concomitant EEG before, during and after the event did not show any EEG change, normal posterior dominant rhythm was noted.  IMPRESSION: This study is within normal limits. No seizures or epileptiform discharges were seen throughout the recording.  One event was recorded on 8/80/2024 at 1147 during which patient was not  responding to staff, with eyes rolled back.  Concomitant EEG before, during and after the event did not show any EEG changes in the seizure.  This was a nonepileptic event.  A normal interictal EEG does not exclude the diagnosis of epilepsy.   [name]

## 2023-02-11 NOTE — Progress Notes (Signed)
Progress Note   Patient: Glenda Kim ZHY:[id number] DOB: [birth year] DOA: 02/08/2023     1 DOS: the patient was seen and examined on 02/11/2023   Brief hospital course: 22 y.o. 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  Patient was found unresponsive by her roommate and brought to ED for evaluation. While patient in the ED waiting room has a questionable seizure-like activity with foaming around her mouth.  -Workup in the ED is negative.  -Admitted for seizure-like activity and improvement of intractable nausea and abdominal pain.   Assessment and Plan:   Syncope, Seizure-like activity: Pt had a new witness partial seizures (around noon time today), currently in post ictal state Patient reported to have multiple syncopal episodes prior to arrival thought secondary to vasovagal or orthostatic hypotension.  Seizure-like activity jerking while in the ED after syncopal episode.   CT scan of the head negative for any acute abnormality.  Admitted for episode with reports of seizure-like activity in June of this year with negative workup including MRI brain with EEG.  Patient was not started on any seizure medication.  Suspect likely convulsive syncope as patient did not seem to have any postictal period. - MRI brain unremarkable - Give IV Ativan 2mg  Q4hrs prn - Consider starting IV Keppra after discussing with Neurology - Replace Mg with 4gm of IV Mg sulfate - Consulted neurology--> may need AEDs - EEG report from this morning:-- No seizures or epileptiform discharges were seen throughout the recording. A normal interictal EEG does not exclude the diagnosis of epilepsy. -Seizure precautions and monitor for seizure activity  Patient instructions at  discharge: Discussed [facility] statutes, patients with seizures are not allowed to drive until they have been seizure-free for six months. Use caution when using heavy equipment or power tools. Avoid working on ladders or at heights. Take showers instead of baths. Ensure the water temperature is not too high on the home water heater. Do not go swimming alone. Do not lock yourself in a room alone (i.e. bathroom). When caring for infants or small children, sit down when holding, feeding, or changing them to minimize risk of injury to the child in the event you have a seizure. Maintain good sleep hygiene. Avoid alcohol.    Hypomagnesemia: Replace Mg with 4gm of IV Mg sulfate, recheck levels tomorrow  Abdominal pain, intractable nausea and vomiting, possible hematemesis: differential includes gastroenteritis and cannabinoid hyperemesis syndrome: improved Lipase and LFTs within normal limits.   CT scan of the abdomen and pelvis noted no acute intra-abdominal process with hepatic steatosis. Patient reports prior cyclical vomiting and gastroparesis as possible causes for symptoms in the past.  -Admit to a medical telemetry bed Clear liquid diet advance diet as tolerated Hemoglobin stable at 11.1 this a.m. Continue antiemetics as needed Change IV PPI to PO   Hypokalemia likely secondary to GI losses: replaced and resolved   Cough: Respiratory virus panel negative Reviewed Chest x-ray- No acute cardiopulmonary process    Headaches Acute on chronic.   CT scan of the brain did not note any acute abnormality. Compazine and Benadryl   Morbid obesity S/p biliopancreatic diversion with duodenal switch BMI 50.12 kg/m.  Patient had biliopancreatic diversion with duodenal switch procedure done previously at [facility] 06/24/2022.   Marijuana use Patient reports intermittent marijuana use.  This could be a possible cause  for her intermittent cyclical vomiting. Follow-up UDS          Subjective:    This morning: Patient denies any nausea, vomiting, abdominal pain able to tolerate her clear liquid diet Denied fever, chills, shortness of breath, chest pain, headache, dizziness, constipation  This afternoon: Pt had a new witness partial seizures, currently in post ictal state  Physical Exam: Vitals:   02/11/23 0439 02/11/23 0726 02/11/23 1100 02/11/23 1155  BP: 117/70 124/79 138/69 138/69  Pulse: (!) 48 71 (!) 56 (!) 50  Resp: 18 17 (!) 22   Temp: 98.2 F (36.8 C) 97.8 F (36.6 C) 98.2 F (36.8 C) 98.2 F (36.8 C)  TempSrc: Oral Oral Oral Oral  SpO2: 97% 99% 99% 99%  Weight:      Height:       Physical Exam Constitutional:      General: She is not in acute distress.    Appearance: Normal appearance. She is obese.  HENT:     Head: Normocephalic and atraumatic.     Nose: Nose normal. No congestion or rhinorrhea.     Mouth/Throat:     Mouth: Mucous membranes are moist.     Pharynx: Oropharynx is clear. No oropharyngeal exudate.  Eyes:     Extraocular Movements: Extraocular movements intact.     Conjunctiva/sclera: Conjunctivae normal.     Pupils: Pupils are equal, round, and reactive to light.  Cardiovascular:     Rate and Rhythm: Normal rate and regular rhythm.     Pulses: Normal pulses.     Heart sounds: Normal heart sounds.  Pulmonary:     Effort: Pulmonary effort is normal.     Breath sounds: Normal breath sounds.  Musculoskeletal:        General: No swelling or tenderness. Normal range of motion.     Cervical back: Normal range of motion and neck supple.     Right lower leg: No edema.     Left lower leg: No edema.  Skin:    General: Skin is warm and dry.     Capillary Refill: Capillary refill takes 2 to 3 seconds.  Neurological:     Mental Status: She is alert. She is disoriented.     Cranial Nerves: No cranial nerve deficit.     Sensory: No sensory deficit.     Motor: No weakness.     Comments: In post ictal state  Psychiatric:     Comments: Unable  to assess     Data Reviewed:  Latest Reference Range & Units 02/11/23 03:59  Sodium 135 - 145 mmol/L 138  Potassium 3.5 - 5.1 mmol/L 3.5  Chloride 98 - 111 mmol/L 107  CO2 22 - 32 mmol/L 22  Glucose 70 - 99 mg/dL 74  BUN 6 - 20 mg/dL 5 (L)  Creatinine 1.61 - 1.00 mg/dL 0.96  Calcium 8.9 - 04.5 mg/dL 8.3 (L)  Anion gap 5 - 15  9  Magnesium 1.7 - 2.4 mg/dL 1.6 (L)  GFR, Estimated >60 mL/min >60  (L): Data is abnormally low  Family Communication:   Disposition: Status [id number]   Planned Discharge Destination: Home    Time spent: 50 minutes  Author: Ernestene Mention, MD 02/11/2023 12:06 PM  For on call review [email or web address].

## 2023-02-11 NOTE — Progress Notes (Signed)
Neurology progress note  S: Patient is feeling well this afternoon with no neurologic complaints.  Overnight EEG 8/17 was normal. At 1148 on 8/18 patient had a typical event with partial jerking of one extremity with her eyes rolled back in her head lasting 3-4 min after with she was confused and lethargic. Dr. Melynda Ripple reviewed the EEG and said there was no ictal correlate before, during, or after the seizure meaning this was a non-epileptic event.  Exam  Vitals:   02/11/23 1155 02/11/23 1550  BP: 138/69 134/71  Pulse: (!) 50 (!) 52  Resp:  18  Temp: 98.2 F (36.8 C) 98.1 F (36.7 C)  SpO2: 99% 99%    Gen: patient lying in bed, NAD CV: extremities appear well-perfused Resp: normal WOB  Neurologic exam MS: alert, oriented x4, follows commands Speech: no dysarthria, no aphasia CN: PERRL, VFF, EOMI, sensation intact, face symmetric, hearing intact to voice Motor: 5/5 strength throughout Sensory: SILT Reflexes: 2+ symm with toes down bilat Coordination: FNF intact bilat Gait: deferred  MRI brain wwo contrast normal  Overnight EEG 8/17 normal  At 1148 on 8/18 patient had a typical event with partial jerking of one extremity with her eyes rolled back in her head lasting 3-4 min after with she was confused and lethargic. Dr. Melynda Ripple reviewed the EEG and said there was no ictal correlate before, during, or after the seizure meaning this was a non-epileptic event.  A/P: 22 year old patient with history of cyclical vomiting syndrome, gastroparesis, endometriosis, anxiety, depression, GERD, marijuana use and 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  Patient has no memory of this and did bite the inside of her mouth during the episode.  She had one prior event her roommate described as unresponsiveness, jerking all over, foaming at the mouth, with  post-event lethargy and confusion (this occurred a few months ago). MRI brain wwo and overnight EEG were normal. A typical spell was captured 8/18 and had no ictal correlate meaning it was nonepileptic. Hospitalist wished to keep her one more night so we will continue LTM until then with plan to discharge tomorrow unless something unexpected happens.   I did have an extensive conversation with her today about nonepileptic spells and that they are not associated with abnormal electrical activity in the brain but are associated with physical manifestations of dress or particularly with remote trauma.  She was very amenable to this concept and was interested in getting a therapist which I said was an excellent idea.  I encouraged her that cognitive behavioral therapy can result in significant improvement in her symptoms.  I did explain to her that according to [facility] law she is not allowed to drive for at least 6 months after any episode whether epileptic or not where she has altered consciousness.  She expressed understanding.  - No indication for AEDs - Continue LTM until tomorrow. If nothing epileptiform overnight OK to d/c LTM in AM - Tentative plan to discharge patient tomorrow. - Neurology will see patient in the morning prior to discharge to reinforce PNES dx, discuss outpatient f/u plan (therapist), and answer any further questions she may have.  Bing Neighbors, MD [facility] [phone]  If 7pm- 7am, please page neurology on call as listed in AMION.   - No

## 2023-02-11 NOTE — Plan of Care (Signed)

## 2023-02-11 NOTE — Discharge Instructions (Signed)
Social Security Office Information Physical Address: [address] [place], [place] [address] Phone: [phone] TTY: [phone] Fax: [phone] Hours:  Monday 9:00 AM - 4:00 PM Tuesday 9:00 AM - 4:00 PM Wednesday 9:00 AM - 4:00 PM Thursday 9:00 AM - 4:00 PM Friday 9:00 AM - 4:00 PM Saturday Closed Sunday Closed

## 2023-02-12 DIAGNOSIS — R569 Unspecified convulsions: Secondary | ICD-10-CM | POA: Diagnosis not present

## 2023-02-12 DIAGNOSIS — R109 Unspecified abdominal pain: Secondary | ICD-10-CM | POA: Diagnosis not present

## 2023-02-12 DIAGNOSIS — R519 Headache, unspecified: Secondary | ICD-10-CM | POA: Diagnosis not present

## 2023-02-12 LAB — BASIC METABOLIC PANEL
Anion gap: 12 (ref 5–15)
BUN: <5 mg/dL — ABNORMAL LOW (ref 6–20)
CO2: 22 mmol/L (ref 22–32)
Calcium: 8.3 mg/dL — ABNORMAL LOW (ref 8.9–10.3)
Chloride: 104 mmol/L (ref 98–111)
Creatinine, Ser: 0.57 mg/dL (ref 0.44–1.00)
GFR, Estimated: >60 mL/min (ref >60)
Glucose, Bld: 87 mg/dL (ref 70–99)
Potassium: 3.5 mmol/L (ref 3.5–5.1)
Sodium: 138 mmol/L (ref 135–145)

## 2023-02-12 LAB — MAGNESIUM: Magnesium: 1.8 mg/dL (ref 1.7–2.4)

## 2023-02-12 NOTE — Procedures (Addendum)
Patient Name: Glenda Kim  MRN: [id number]  Epilepsy Attending: Charlsie Quest  Referring Physician/Provider: Marjorie Smolder, NP  Duration: 8/18/202 1545 to 02/12/2023 1056   Patient history: 22 year old patient with history of cyclical vomiting syndrome, gastroparesis, endometriosis, anxiety, depression, GERD, marijuana use and 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 EEG to evaluate for seizure   Level of alertness: Awake, asleep   AEDs during EEG study: None   Technical aspects: This EEG study was done with scalp electrodes positioned according to the 10-20 International system of electrode placement. Electrical activity was reviewed with band pass filter of 1-70Hz , sensitivity of 7 uV/mm, display speed of 64mm/sec with a 60Hz  notched filter applied as appropriate. EEG data were recorded continuously and digitally stored.  Video monitoring was available and reviewed as appropriate.   Description: The posterior dominant rhythm consists of 8-9Hz  activity of moderate voltage (25-35 uV) seen predominantly in posterior head regions, symmetric and reactive to eye opening and eye closing. Sleep was characterized by vertex waves, sleep spindles (12 to 14 Hz), maximal frontocentral region.  Hyperventilation and photic stimulation were not performed.      IMPRESSION: This study is within normal limits. No seizures or epileptiform discharges were seen throughout the recording.  Glenda Kim

## 2023-02-12 NOTE — Plan of Care (Signed)

## 2023-02-12 NOTE — Discharge Summary (Signed)
Physician Discharge Summary   Patient: Glenda Kim MRN: [id number] DOB: 06-07-2001  Admit date:     02/08/2023  Discharge date: 02/12/23  Discharge Physician: Arnetha Courser   PCP: Arnette Felts, FNP   Recommendations at discharge:  Follow-up with outpatient neurology-please reevaluate for driving privileges. Patient will get benefit from cognitive behavioral therapy which can be arranged either by PCP or outpatient neurology Follow-up with primary care provider  Discharge Diagnoses: Active Problems:   Chronic headaches   Morbid obesity (HCC)   Marijuana use  Principal Problem (Resolved):   Intractable abdominal pain Resolved Problems:   Cough   Syncope   Seizure-like activity [facility])  Hospital Course: 22 y.o. 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  Patient was found unresponsive by her roommate and brought to ED for evaluation. While patient in the ED waiting room has a questionable seizure-like activity with foaming around her mouth.  Patient was admitted for seizure-like activity and improvement of nausea and vomiting.  Her nausea, vomiting and abdominal pain improved.  Patient underwent continuous EEG by neurology and did not find any epileptiform discharges.A typical spell was captured 8/18 and had no ictal correlate meaning it was nonepileptic.  Neurology recommended cognitive behavioral therapy which she was interested, that can be arranged either by her outpatient neurologist or primary care provider.  Patient had extensive discussion regarding do not drive as she did had some disorientation after spell.  She cannot drive for 6 months after the last spell per [place] DMV law, her neurologist can reevaluate and recommend when she can start  driving.  Patient should avoid marijuana as it can cause cyclical nausea and vomiting.  Patient will continue on current medications and need to have a close follow-up with her providers for further recommendations.   Consultants: Neurology Procedures performed: Continuous EEG Disposition: Home Diet recommendation:  Discharge Diet Orders (From admission, onward)     Start     Ordered   02/12/23 0000  Diet - low sodium heart healthy        02/12/23 1205           Regular diet DISCHARGE MEDICATION: Allergies as of 02/12/2023       Reactions   Shellfish Allergy Anaphylaxis   Bee Pollen Swelling   Pollen General         Medication List     TAKE these medications    doxylamine (Sleep) 25 MG tablet Commonly known as: UNISOM Take 25 mg by mouth at bedtime.   metoCLOPramide 5 MG tablet Commonly known as: REGLAN Take 1 tablet (5 mg total) by mouth 3 (three) times daily before meals.   pantoprazole 40 MG tablet Commonly known as: Protonix Take 1 tablet (40 mg total) by mouth daily.   sucralfate 1 GM/10ML suspension Commonly known as: CARAFATE Take 10 mLs (1 g total) by mouth 4 (four) times daily -  with meals and at bedtime for 10 days.        Follow-up Information     Arnette Felts, FNP Follow up in 3 day(s).   Specialty: General Practice Contact information: [address] [place] [address] [phone]                Discharge Exam: Ceasar Mons Weights   02/08/23 2303  Weight: (!) 145.2 kg   General.  Morbidly obese  lady, in no acute distress. Pulmonary.  Lungs clear bilaterally, normal respiratory effort. CV.  Regular rate and rhythm, no JVD, rub or murmur. Abdomen.  Soft, nontender, nondistended, BS positive. CNS.  Alert and oriented .  No focal neurologic deficit. Extremities.  No edema, no cyanosis, pulses intact and symmetrical. Psychiatry.  Judgment and insight appears normal.   Condition at discharge: stable  The  results of significant diagnostics from this hospitalization (including imaging, microbiology, ancillary and laboratory) are listed below for reference.   Imaging Studies: Overnight EEG with video  Result Date: 02/11/2023 Charlsie Quest, MD     02/12/2023 10:05 AM Patient Name: [name] MRN: [id number] Epilepsy Attending: Charlsie Quest Referring Physician/Provider: Marjorie Smolder, NP Duration: 8/17/202 1545 to 02/11/2023 1545 Patient history: 22 year old patient with history of cyclical vomiting syndrome, gastroparesis, endometriosis, anxiety, depression, GERD, marijuana use and 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 EEG to evaluate for seizure Level of alertness: Awake, asleep AEDs during EEG study: None Technical aspects: This EEG study was done with scalp electrodes positioned according to the 10-20 International system of electrode placement. Electrical activity was reviewed with band pass filter of 1-70Hz , sensitivity of 7 uV/mm, display speed of 68mm/sec with a 60Hz  notched filter applied as appropriate. EEG data were recorded continuously and digitally stored.  Video monitoring was available and reviewed as appropriate. Description: The posterior dominant rhythm consists of 8-9Hz  activity of moderate voltage (25-35 uV) seen predominantly in posterior head regions, symmetric and reactive to eye opening and eye closing. Sleep was characterized by vertex waves, sleep spindles (12 to 14 Hz), maximal frontocentral region.  Hyperventilation and photic stimulation were not performed.   One event was recorded on 02/11/2023 at 1147.  Patient was laying in bed, eyes were, not responding to staff.  On passively opening her eyes, staff also noted that her eyes were rolled back.  Concomitant EEG before, during and after the event did not show any EEG change,  normal posterior dominant rhythm was noted. IMPRESSION: This study is within normal limits. No seizures or epileptiform discharges were seen throughout the recording. One event was recorded on 8/80/2024 at 1147 during which patient was not responding to staff, with eyes rolled back.  Concomitant EEG before, during and after the event did not show any EEG changes in the seizure.  This was a nonepileptic event. A normal interictal EEG does not exclude the diagnosis of epilepsy. Charlsie Quest   MR BRAIN W WO CONTRAST  Result Date: 02/10/2023 CLINICAL DATA:  Seizure disorder, clinical change. EXAM: MRI HEAD WITHOUT AND WITH CONTRAST TECHNIQUE: Multiplanar, multiecho pulse sequences of the brain and surrounding structures were obtained without and with intravenous contrast. CONTRAST:  10mL GADAVIST GADOBUTROL 1 MMOL/ML IV SOLN COMPARISON:  MRI brain 12/01/2022. FINDINGS: Brain: No acute infarct or hemorrhage. No hydrocephalus or extra-axial collection. No foci of abnormal susceptibility. No mass or abnormal enhancement. Vascular: Normal flow voids and vessel enhancement. Skull and upper cervical spine: Normal marrow signal and enhancement. Sinuses/Orbits: No acute findings. Other: None. IMPRESSION: Normal brain MRI. Electronically Signed   By: Orvan Falconer M.D.   On: 02/10/2023 19:05   DG CHEST PORT 1 VIEW  Result Date: 02/09/2023 CLINICAL DATA:  Nausea, shortness of breath, vomiting, cough EXAM: PORTABLE CHEST 1 VIEW COMPARISON:  Radiograph 11/30/2022 FINDINGS: Normal heart size. Prominent central pulmonary  arteries. Both lungs are clear. The visualized skeletal structures are unremarkable. IMPRESSION: No acute cardiopulmonary process. Electronically Signed   By: Minerva Fester M.D.   On: 02/09/2023 19:38   CT ABDOMEN PELVIS W CONTRAST  Result Date: 02/09/2023 CLINICAL DATA:  Syncope, seizure, confusion, and slurred speech. EXAM: CT ABDOMEN AND PELVIS WITH CONTRAST TECHNIQUE: Multidetector CT imaging  of the abdomen and pelvis was performed using the standard protocol following bolus administration of intravenous contrast. RADIATION DOSE REDUCTION: This exam was performed according to the departmental dose-optimization program which includes automated exposure control, adjustment of the mA and/or kV according to patient size and/or use of iterative reconstruction technique. CONTRAST:  OMNIPAQUE IOHEXOL 300 MG/ML  SOLN COMPARISON:  12/02/2022. FINDINGS: Lower chest: No acute abnormality. Hepatobiliary: No focal liver abnormality is seen. Fatty infiltration of the liver is noted. Status post cholecystectomy. No biliary dilatation. Pancreas: Unremarkable. No pancreatic ductal dilatation or surrounding inflammatory changes. Spleen: Normal in size without focal abnormality. Adrenals/Urinary Tract: The adrenal glands are within normal limits. The kidneys enhance symmetrically. No renal calculus or hydronephrosis. The bladder is unremarkable. Stomach/Bowel: Gastric surgery changes are noted. Appendix appears normal. No evidence of bowel wall thickening, distention, or inflammatory changes. No free air or pneumatosis. Vascular/Lymphatic: No significant vascular findings are present. No enlarged abdominal or pelvic lymph nodes. Reproductive: The uterus is within normal limits. There is a cystic structure in the left ovary measuring 4.3 cm, decreased in size from the prior exam. No follow-up imaging is recommended. No adnexal mass in the right. Other: A small amount of free fluid is present in the cul-de-sac which may be physiologic. Musculoskeletal: A hemangioma is noted in the L1 vertebral body. There is a stable mixed lytic and sclerotic lesion in the iliac bone on the right. No acute osseous abnormality. IMPRESSION: 1. No acute intra-abdominal process. 2. Hepatic steatosis. Electronically Signed   By: Thornell Sartorius M.D.   On: 02/09/2023 00:46   CT Head Wo Contrast  Result Date: 02/09/2023 CLINICAL DATA:   Altered mental status, seizure EXAM: CT HEAD WITHOUT CONTRAST TECHNIQUE: Contiguous axial images were obtained from the base of the skull through the vertex without intravenous contrast. RADIATION DOSE REDUCTION: This exam was performed according to the departmental dose-optimization program which includes automated exposure control, adjustment of the mA and/or kV according to patient size and/or use of iterative reconstruction technique. COMPARISON:  MRI brain dated 12/01/2022 FINDINGS: Brain: No evidence of acute infarction, hemorrhage, hydrocephalus, extra-axial collection or mass lesion/mass effect. Vascular: No hyperdense vessel or unexpected calcification. Skull: Normal. Negative for fracture or focal lesion. Sinuses/Orbits: The visualized paranasal sinuses are essentially clear. The mastoid air cells are unopacified. Other: None. IMPRESSION: Normal head CT. Electronically Signed   By: Charline Bills M.D.   On: 02/09/2023 00:44    Microbiology: Results for orders placed or performed during the hospital encounter of 02/08/23  Respiratory (~20 pathogens) panel by PCR     Status: None   Collection Time: 02/09/23  3:36 PM   Specimen: Nasopharyngeal Swab; Respiratory  Result Value Ref Range Status   Adenovirus NOT DETECTED NOT DETECTED Final   Coronavirus 229E NOT DETECTED NOT DETECTED Final    Comment: (NOTE) The Coronavirus on the Respiratory Panel, DOES NOT test for the novel  Coronavirus (2019 nCoV)    Coronavirus HKU1 NOT DETECTED NOT DETECTED Final   Coronavirus NL63 NOT DETECTED NOT DETECTED Final   Coronavirus OC43 NOT DETECTED NOT DETECTED Final   Metapneumovirus NOT DETECTED NOT  DETECTED Final   Rhinovirus / Enterovirus NOT DETECTED NOT DETECTED Final   Influenza A NOT DETECTED NOT DETECTED Final   Influenza B NOT DETECTED NOT DETECTED Final   Parainfluenza Virus 1 NOT DETECTED NOT DETECTED Final   Parainfluenza Virus 2 NOT DETECTED NOT DETECTED Final   Parainfluenza Virus 3 NOT  DETECTED NOT DETECTED Final   Parainfluenza Virus 4 NOT DETECTED NOT DETECTED Final   Respiratory Syncytial Virus NOT DETECTED NOT DETECTED Final   Bordetella pertussis NOT DETECTED NOT DETECTED Final   Bordetella Parapertussis NOT DETECTED NOT DETECTED Final   Chlamydophila pneumoniae NOT DETECTED NOT DETECTED Final   Mycoplasma pneumoniae NOT DETECTED NOT DETECTED Final    Comment: Performed at [facility], [address] [place], [place] [address]    Labs: CBC: Recent Labs  Lab 02/08/23 2309 02/09/23 1533 02/10/23 0330 02/11/23 0359  WBC 9.8  --  8.0 6.9  NEUTROABS 7.4  --   --   --   HGB 13.3 11.5* 11.2* 11.1*  HCT 39.9 35.2* 34.5* 34.1*  MCV 94.3  --  98.0 98.3  PLT 244  --  196 185   Basic Metabolic Panel: Recent Labs  Lab 02/08/23 2309 02/10/23 0330 02/11/23 0359 02/12/23 0941  NA 140 139 138 138  K 3.7 3.1* 3.5 3.5  CL 107 104 107 104  CO2 23 25 22 22   GLUCOSE 113* 73 74 87  BUN 6 <5* 5* <5*  CREATININE 0.59 0.50 0.66 0.57  CALCIUM 9.7 8.5* 8.3* 8.3*  MG  --  1.8 1.6* 1.8   Liver Function Tests: Recent Labs  Lab 02/08/23 2309  AST 15  ALT 21  ALKPHOS 106  BILITOT 0.8  PROT 7.0  ALBUMIN 4.1   CBG: Recent Labs  Lab 02/08/23 2300  GLUCAP 110*    Discharge time spent: greater than 30 minutes.  This record has been created using Conservation officer, historic buildings. Errors have been sought and corrected,but may not always be located. Such creation errors do not reflect on the standard of care.   Signed: Arnetha Courser, MD [facility] 02/12/2023

## 2023-02-12 NOTE — Progress Notes (Signed)
LTM EEG discontinued - Mild redness at EKG leads. RN aware.

## 2023-02-13 ENCOUNTER — Telehealth: Payer: Self-pay

## 2023-02-13 NOTE — Transitions of Care (Post Inpatient/ED Visit) (Unsigned)
   02/13/2023  Name: Glenda Kim MRN: [id number] DOB: [birth year]  Today's TOC FU Call Status: Today's TOC FU Call Status:: Unsuccessful Call (1st Attempt) Unsuccessful Call (1st Attempt) Date: 02/13/23  Attempted to reach the patient regarding the most recent Inpatient/ED visit.  Follow Up Plan: Additional outreach attempts will be made to reach the patient to complete the Transitions of Care (Post Inpatient/ED visit) call.   Signature   Woodfin Ganja LPN [facility] Nurse Health Advisor Direct Dial [phone]

## 2023-02-13 NOTE — Transitions of Care (Post Inpatient/ED Visit) (Signed)
   02/13/2023  Name: Glenda Kim MRN: [id number] DOB: [birth year]  Today's TOC FU Call Status: Today's TOC FU Call Status:: Unsuccessful Call (1st Attempt) Unsuccessful Call (1st Attempt) Date: 02/13/23  Attempted to reach the patient regarding the most recent Inpatient/ED visit.  Follow Up Plan: Additional outreach attempts will be made to reach the patient to complete the Transitions of Care (Post Inpatient/ED visit) call.   Signature Lisabeth Devoid, [place]

## 2023-02-14 ENCOUNTER — Telehealth: Payer: Self-pay

## 2023-02-14 ENCOUNTER — Encounter: Payer: Self-pay | Admitting: Neurology

## 2023-02-14 ENCOUNTER — Encounter: Payer: Self-pay | Admitting: Nurse Practitioner

## 2023-02-14 NOTE — Telephone Encounter (Signed)
error 

## 2023-02-14 NOTE — Transitions of Care (Post Inpatient/ED Visit) (Signed)
   02/14/2023  Name: Arayla Sazama MRN: [id number] DOB: 2001-04-03  Today's TOC FU Call Status: Today's TOC FU Call Status:: Successful TOC FU Call Completed Unsuccessful Call (1st Attempt) Date: 02/13/23 [facility] FU Call Complete Date: 02/14/23  Transition Care Management Follow-up Telephone Call Date of Discharge: 02/12/23 Discharge Facility: [facility]) Type of Discharge: Inpatient Admission Primary Inpatient Discharge Diagnosis:: Chronic headaches How have you been since you were released from the hospital?: Better Any questions or concerns?: Yes Patient Questions/Concerns:: (S) pt needs note for work stating she can't drive due to seizure- she will be sending you a message in my chart Patient Questions/Concerns Addressed: (S) Notified Provider of Patient Questions/Concerns  Items Reviewed: Did you receive and understand the discharge instructions provided?: Yes Medications obtained,verified, and reconciled?: Yes (Medications Reviewed) Any new allergies since your discharge?: No Dietary orders reviewed?: Yes Do you have support at home?: Yes  Medications Reviewed Today: Medications Reviewed Today     Reviewed by Merleen Nicely, LPN (Licensed Practical Nurse) on 02/14/23 at 1035  Med List Status: <None>   Medication Order Taking? Sig Documenting Provider Last Dose Status Informant  Discontinued 02/14/23 1035   Discontinued 02/14/23 1035     Discontinued 02/14/23 1035     Discontinued 02/14/23 1035             Home Care and Equipment/Supplies: Were Home Health Services Ordered?: No Any new equipment or medical supplies ordered?: No  Functional Questionnaire: Do you need assistance with bathing/showering or dressing?: No Do you need assistance with meal preparation?: No Do you need assistance with eating?: No Do you have difficulty maintaining continence: No Do you need assistance with getting out of bed/getting out of a chair/moving?: No Do you have difficulty  managing or taking your medications?: No  Follow up appointments reviewed: PCP Follow-up appointment confirmed?: Yes Date of PCP follow-up appointment?: 02/22/23 Follow-up Provider: Arnette Felts FNP Specialist Hospital Follow-up appointment confirmed?: Yes Date of Specialist follow-up appointment?: 02/28/23 Follow-Up Specialty Provider:: Neurology Do you need transportation to your follow-up appointment?: No Do you understand care options if your condition(s) worsen?: Yes-patient verbalized understanding    SIGNATURE  Woodfin Ganja LPN [facility] Nurse Health Advisor Direct Dial [phone]

## 2023-02-16 ENCOUNTER — Ambulatory Visit: Payer: No Typology Code available for payment source | Admitting: Radiology

## 2023-02-22 ENCOUNTER — Ambulatory Visit: Payer: No Typology Code available for payment source | Admitting: Radiology

## 2023-02-22 ENCOUNTER — Inpatient Hospital Stay: Payer: No Typology Code available for payment source | Admitting: Nurse Practitioner

## 2023-02-22 NOTE — Progress Notes (Deleted)
Glenda Kim, CMA,acting as a Neurosurgeon for Glenda Felts, FNP.,have documented all relevant documentation on the behalf of Glenda Felts, FNP,as directed by  Glenda Felts, FNP while in the presence of Glenda Felts, FNP.  Subjective:  Patient ID: [name] , adult    DOB: 03-08-2001 , 22 y.o.   MRN: [id number]  No chief complaint on file.   HPI  Patient presents today for a hospital follow up, patient went to the hospital on 02/08/2023 and was discharged 02/12/2023. Patient went to the hospital for Intractable abdominal pain. Patient has previously had weight loss surgery, today patient is feeling     Past Medical History:  Diagnosis Date  . Anxiety   . Cyclic vomiting syndrome   . Depression   . Endometriosis   . Gastroparesis   . GERD (gastroesophageal reflux disease)   . Morbid obesity (HCC)      Family History  Problem Relation Age of Onset  . Depression Mother   . Anxiety disorder Mother   . Heart disease Father   . Cancer Father   . Healthy Sister   . Depression Brother   . Anxiety disorder Brother   . Irritable bowel syndrome Maternal Grandmother   . Crohn's disease Maternal Grandmother   . Cancer Maternal Grandfather   . Hypertension Paternal Grandmother     No current outpatient medications on file.   Allergies  Allergen Reactions  . Shellfish Allergy Anaphylaxis  . Bee Pollen Swelling    Pollen General        Review of Systems   There were no vitals filed for this visit. There is no height or weight on file to calculate BMI.  Wt Readings from Last 3 Encounters:  02/08/23 (!) 320 lb (145.2 kg)  11/30/22 (!) 338 lb 9.6 oz (153.6 kg)  10/06/22 (!) 372 lb (168.7 kg)    The ASCVD Risk score (Arnett DK, et al., 2019) failed to calculate for the following reasons:   The 2019 ASCVD risk score is only valid for ages 63 to 65  Objective:  Physical Exam      Assessment And Plan:  Hospital discharge follow-up  Intractable abdominal pain    No  follow-ups on file.  Patient was given opportunity to ask questions. Patient verbalized understanding of the plan and was able to repeat key elements of the plan. All questions were answered to their satisfaction.    Jeanell Sparrow, FNP, have reviewed all documentation for this visit. The documentation on 02/22/23 for the exam, diagnosis, procedures, and orders are all accurate and complete.   IF YOU HAVE BEEN REFERRED TO A SPECIALIST, IT MAY TAKE 1-2 WEEKS TO SCHEDULE/PROCESS THE REFERRAL. IF YOU HAVE NOT HEARD FROM US/SPECIALIST IN TWO WEEKS, PLEASE GIVE [place] A CALL AT [phone].

## 2023-02-28 ENCOUNTER — Encounter: Payer: Self-pay | Admitting: Neurology

## 2023-02-28 ENCOUNTER — Institutional Professional Consult (permissible substitution): Payer: No Typology Code available for payment source | Admitting: Neurology

## 2023-03-22 ENCOUNTER — Encounter: Payer: Self-pay | Admitting: Neurology

## 2023-03-22 ENCOUNTER — Ambulatory Visit (INDEPENDENT_AMBULATORY_CARE_PROVIDER_SITE_OTHER): Payer: Medicaid Other | Admitting: Neurology

## 2023-03-22 VITALS — BP 122/84 | HR 85 | Ht 66.0 in | Wt 297.8 lb

## 2023-03-22 DIAGNOSIS — F445 Conversion disorder with seizures or convulsions: Secondary | ICD-10-CM | POA: Diagnosis not present

## 2023-03-22 DIAGNOSIS — F4310 Post-traumatic stress disorder, unspecified: Secondary | ICD-10-CM | POA: Diagnosis not present
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
## 2023-03-26 ENCOUNTER — Telehealth: Payer: Self-pay | Admitting: Neurology

## 2023-03-26 NOTE — Telephone Encounter (Signed)
Referral for psychology fax to [facility]. Phone: [phone], Fax: [phone].

## 2023-04-30 ENCOUNTER — Telehealth: Payer: Self-pay

## 2023-04-30 ENCOUNTER — Emergency Department (HOSPITAL_COMMUNITY)
Admission: EM | Admit: 2023-04-30 | Discharge: 2023-04-30 | Disposition: A | Discharge status: Home / Self Care | Payer: Medicaid Other | Attending: Emergency Medicine | Admitting: Emergency Medicine

## 2023-04-30 ENCOUNTER — Encounter (HOSPITAL_COMMUNITY): Payer: Self-pay | Admitting: Emergency Medicine

## 2023-04-30 ENCOUNTER — Other Ambulatory Visit: Payer: Self-pay

## 2023-04-30 DIAGNOSIS — R112 Nausea with vomiting, unspecified: Secondary | ICD-10-CM | POA: Insufficient documentation

## 2023-04-30 DIAGNOSIS — R110 Nausea: Secondary | ICD-10-CM

## 2023-04-30 LAB — COMPREHENSIVE METABOLIC PANEL
ALT: 44 U/L (ref 0–44)
AST: 34 U/L (ref 15–41)
Albumin: 3.4 g/dL — ABNORMAL LOW (ref 3.5–5.0)
Alkaline Phosphatase: 73 U/L (ref 38–126)
Anion gap: 7 (ref 5–15)
BUN: 8 mg/dL (ref 6–20)
CO2: 22 mmol/L (ref 22–32)
Calcium: 8.4 mg/dL — ABNORMAL LOW (ref 8.9–10.3)
Chloride: 111 mmol/L (ref 98–111)
Creatinine, Ser: 0.62 mg/dL (ref 0.44–1.00)
GFR, Estimated: >60 mL/min (ref >60)
Glucose, Bld: 82 mg/dL (ref 70–99)
Potassium: 3.7 mmol/L (ref 3.5–5.1)
Sodium: 140 mmol/L (ref 135–145)
Total Bilirubin: 0.4 mg/dL (ref <1.2)
Total Protein: 6.1 g/dL — ABNORMAL LOW (ref 6.5–8.1)

## 2023-04-30 LAB — CBC
HCT: 35.1 % — ABNORMAL LOW (ref 36.0–46.0)
Hemoglobin: 10.9 g/dL — ABNORMAL LOW (ref 12.0–15.0)
MCH: 31.9 pg (ref 26.0–34.0)
MCHC: 31.1 g/dL (ref 30.0–36.0)
MCV: 102.6 fL — ABNORMAL HIGH (ref 80.0–100.0)
Platelets: 192 10*3/uL (ref 150–400)
RBC: 3.42 MIL/uL — ABNORMAL LOW (ref 3.87–5.11)
RDW: 14 % (ref 11.5–15.5)
WBC: 9.3 10*3/uL (ref 4.0–10.5)
nRBC: 0 % (ref 0.0–0.2)

## 2023-04-30 LAB — URINALYSIS, ROUTINE W REFLEX MICROSCOPIC
Bilirubin Urine: NEGATIVE
Glucose, UA: NEGATIVE mg/dL
Hgb urine dipstick: NEGATIVE
Ketones, ur: NEGATIVE mg/dL
Leukocytes,Ua: NEGATIVE
Nitrite: NEGATIVE
Protein, ur: NEGATIVE mg/dL
Specific Gravity, Urine: 1.025 (ref 1.005–1.030)
pH: 5 (ref 5.0–8.0)

## 2023-04-30 LAB — HCG, SERUM, QUALITATIVE: Preg, Serum: NEGATIVE

## 2023-04-30 LAB — LIPASE, BLOOD: Lipase: 20 U/L (ref 11–51)

## 2023-04-30 MED ORDER — METOCLOPRAMIDE HCL 5 MG/ML IJ SOLN
10.0000 mg | Freq: Once | INTRAMUSCULAR | Status: AC
  Administered 2023-04-30: 10 mg via INTRAVENOUS
  Filled 2023-04-30: qty 2

## 2023-04-30 MED ORDER — METOCLOPRAMIDE HCL 10 MG PO TABS: 10.0000 mg | ORAL_TABLET | Freq: Four times a day (QID) | ORAL | 0 refills | Status: DC

## 2023-04-30 NOTE — ED Provider Notes (Signed)
[facility]-EMERGENCY DEPT [facility] Emergency Department Provider Note MRN:  [id number]  Arrival date & time: 04/30/23     Chief Complaint   Emesis and Weakness   History of Present Illness   Glenda Kim is a 22 y.o. year-old adult presents to the ED with chief complaint of nausea.  They state that they have had a couple of episodes of vomiting.  They also feel like they have been cramping and seizing up.  Patient denies any fever or chills.  Reports past history of gastric bypass.  Reports history of gastroparesis and feels like they are about to have gastroparesis flare.  Patient is concerned about electrolyte levels.  Denies any successful treatments prior to arrival.  History provided by patient.   Review of Systems  Pertinent positive and negative review of systems noted in HPI.    Physical Exam   Vitals:   04/30/23 0258  BP: (!) 154/103  Pulse: 61  Resp: 17  Temp: 98.5 F (36.9 C)  SpO2: 100%    CONSTITUTIONAL:  non toxic-appearing, NAD NEURO:  Alert and oriented x 3, CN 3-12 grossly intact EYES:  eyes equal and reactive ENT/NECK:  Supple, no stridor  CARDIO:  normal rate, regular rhythm, appears well-perfused  PULM:  No respiratory distress, CTAB GI/GU:  non-distended, no focal abdominal tenderness MSK/SPINE:  No gross deformities, no edema, moves all extremities  SKIN:  no rash, atraumatic   *Additional and/or pertinent findings included in MDM below  Diagnostic and Interventional Summary    EKG Interpretation Date/Time:  Monday April 30 2023 02:58:21 EST Ventricular Rate:  63 PR Interval:  146 QRS Duration:  77 QT Interval:  389 QTC Calculation: 399 R Axis:   40  Text Interpretation: Sinus rhythm Abnormal Q suggests inferior infarct ST elev, probable normal early repol pattern No acute changes Confirmed by Gilda Crease [id number]) on 04/30/2023 3:02:49 AM       Labs Reviewed  COMPREHENSIVE METABOLIC PANEL - Abnormal; Notable for the  following components:      Result Value   Calcium 8.4 (*)    Total Protein 6.1 (*)    Albumin 3.4 (*)    All other components within normal limits  CBC - Abnormal; Notable for the following components:   RBC 3.42 (*)    Hemoglobin 10.9 (*)    HCT 35.1 (*)    MCV 102.6 (*)    All other components within normal limits  LIPASE, BLOOD  URINALYSIS, ROUTINE W REFLEX MICROSCOPIC  HCG, SERUM, QUALITATIVE    No orders to display    Medications  metoCLOPramide (REGLAN) injection 10 mg (10 mg Intravenous Given 04/30/23 0415)     Procedures  /  Critical Care Procedures  ED Course and Medical Decision Making  I have reviewed the triage vital signs, the nursing notes, and pertinent available records from the EMR.  Social Determinants Affecting Complexity of Care: Patient has no clinically significant social determinants affecting this chief complaint..   ED Course: Clinical Course as of 04/30/23 0549  [facility] Apr 30, 2023  8841 Urinalysis, Routine w reflex microscopic -Urine, Clean Catch Inconsistent with UTI [RB]  0547 Comprehensive metabolic panel(!) No marked electrolyte abnormality, potassium 3.7 [RB]  0548 hCG, serum, qualitative Pregnancy test negative [RB]  0548 CBC(!) No leukocytosis, baseline anemia [RB]    Clinical Course User Index [RB] Roxy Horseman, PA-C    Medical Decision Making Patient here with multiple complaints, primarily nausea and intermittent episodes of vomiting.  Also feel  like they are having some cramping or spasms.  Will check basic labs and treat nausea with Reglan.  Labs are reassuring and are discussed below.  Patient remains nontoxic in appearance.  Feeling improved after Reglan.  Will Rx the same.  Feel the patient is stable for discharge and outpatient follow-up.  Given reassuring workup, I do not think patient has acute abdomen or other emergent pathology requiring further workup or evaluation in the ED.  Amount and/or Complexity of Data  Reviewed Labs: ordered.  Risk Prescription drug management.         Consultants: No consultations were needed in caring for this patient.   Treatment and Plan: Emergency department workup does not suggest an emergent condition requiring admission or immediate intervention beyond  what has been performed at this time. The patient is safe for discharge and has  been instructed to return immediately for worsening symptoms, change in  symptoms or any other concerns    Final Clinical Impressions(s) / ED Diagnoses     ICD-10-CM   1. Nausea  R11.0       ED Discharge Orders          Ordered    metoCLOPramide (REGLAN) 10 MG tablet  Every 6 hours        04/30/23 0541              Discharge Instructions Discussed with and Provided to Patient:    Discharge Instructions      Your tests looked good tonight.  Stay hydrated.  Take medications as directed.  Return for new or worsening symptoms.      Roxy Horseman, PA-C 04/30/23 4098    Gilda Crease, MD 04/30/23 [phone]

## 2023-04-30 NOTE — ED Triage Notes (Signed)
Pt reports N/V, dehydration and weakness x a few days. Reports she has gastroparesis and got bariatric surgery recently and doesn't believe her protein levels are where they need to be. She also reports she has a history of stress induced seizures and has been having them more frequently.

## 2023-04-30 NOTE — Transitions of Care (Post Inpatient/ED Visit) (Signed)
   04/30/2023  Name: Glenda Kim MRN: [id number] DOB: Jun 07, 2001  Today's TOC FU Call Status: Today's TOC FU Call Status:: Unsuccessful Call (1st Attempt) Unsuccessful Call (1st Attempt) Date: 04/30/23  Attempted to reach the patient regarding the most recent Inpatient/ED visit.  Follow Up Plan: Additional outreach attempts will be made to reach the patient to complete the Transitions of Care (Post Inpatient/ED visit) call.   Signature Lisabeth Devoid, [place]

## 2023-04-30 NOTE — Discharge Instructions (Signed)
Your tests looked good tonight.  Stay hydrated.  Take medications as directed.  Return for new or worsening symptoms.

## 2023-05-01 ENCOUNTER — Telehealth: Payer: Self-pay

## 2023-05-01 NOTE — Transitions of Care (Post Inpatient/ED Visit) (Signed)
   05/01/2023  Name: Glenda Kim MRN: [id number] DOB: [birth year]  Today's TOC FU Call Status: Today's TOC FU Call Status:: Unsuccessful Call (2nd Attempt) Unsuccessful Call (2nd Attempt) Date: 05/01/23 Patient's Name and Date of Birth confirmed.  Transition Care Management Follow-up Telephone Call Date of Discharge: 04/30/23 Discharge Facility: [facility]) Type of Discharge: Inpatient Admission Primary Inpatient Discharge Diagnosis:: Nausea  Items Reviewed:    Medications Reviewed Today: Medications Reviewed Today   Medications were not reviewed in this encounter     Home Care and Equipment/Supplies:    Functional Questionnaire:    Follow up appointments reviewed:      Vivien Presto, CMA

## 2023-05-01 NOTE — Transitions of Care (Post Inpatient/ED Visit) (Signed)
   05/01/2023  Name: Glenda Kim MRN: [id number] DOB: 2001-06-03  Today's TOC FU Call Status: Today's TOC FU Call Status:: Unsuccessful Call (3rd Attempt) Unsuccessful Call (3rd Attempt) Date: 05/01/23  Attempted to reach the patient regarding the most recent Inpatient/ED visit.  Follow Up Plan: No further outreach attempts will be made at this time. We have been unable to contact the patient.  Signature  Lisabeth Devoid, [place]

## 2023-05-07 ENCOUNTER — Encounter: Payer: Self-pay | Admitting: Nurse Practitioner

## 2023-05-28 ENCOUNTER — Institutional Professional Consult (permissible substitution): Payer: No Typology Code available for payment source | Admitting: Neurology

## 2023-10-22 ENCOUNTER — Telehealth: Payer: Self-pay | Admitting: Adult Health

## 2023-10-22 NOTE — Telephone Encounter (Signed)
 [facility] conf

## 2023-10-24 ENCOUNTER — Ambulatory Visit: Payer: No Typology Code available for payment source | Admitting: Adult Health

## 2023-10-30 NOTE — Progress Notes (Unsigned)
 [facility] [address] [place]. [place] [address] [phone]       OFFICE FOLLOW UP NOTE    Glenda Kim Date of Birth:  06-11-2001 Medical Record Number:  [id number]    Primary neurologist: Dr. Albertina Hugger Reason for visit: Pseudoseizure, hx of IIH    SUBJECTIVE:   CHIEF COMPLAINT:  Chief Complaint  Patient presents with   Seizures    Rm 3 alone Pt is well and stable, reports no sz or new concerns since last visit     HPI:    Update 10/31/2023 JM: Patient returns for follow-up visit unaccompanied.  I have personally previously seen patient for IIH and OSA on CPAP in 02/2022 but no showed/canceled follow-up visits.  Previously seen by Dr. Albertina Hugger 02/2023 for hospital follow-up for seizure-like activity (initially presented to ED for intractable abdominal pain and found unresponsive by roommate, while in ED waiting room had questionable seizure-like activity with foaming around her mouth) with a ED workup largely benign including MRI brain and EEG which captured spell without ictal correlation thus indicating nonepileptic spell.  She was referred to psychology.   Currently, reports she is doing well.  She did have 1 additional event similar to prior episode in October but no additional events since that time.  She has not yet established care with psychology as she was not contacted to schedule.  She is interested in establishing care with behavioral health as she does continue to struggle with depression and anxiety.   Prior history of IIH, denies any recent headaches.  Denies any vision changes or tinnitus.  She has not recently been seen by ophthalmology.  She is now living in [place] and is trying to find an ophthalmologist who accepts her insurance.  She does note significant sensitivity to light over the past couple weeks.  No longer using CPAP.  She actually underwent bariatric surgery in 2023 and has lost over 100 pounds. Weight today 281 lbs, goal  weight 260 lbs. Prior to sleep study, weight 437 lbs. Reports she has slept better since weight loss, having less daytime fatigue and less snoring.       History provided for reference purposes only Update 02/28/2022 JM: Patient returns for initial CPAP compliance visit as well as IIH follow-up unaccompanied.    Prior complaints of headache and visual changes.  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 Underwent LP 08/2021 which showed opening pressure of 41 and started on Diamox  250 mg twice daily.  Reports initial improvement of symptoms especially for the first month but then gradually started worsening again. She stopped taking Diamox  last month as she ran out of medication, tolerated while taking. She does report occasional headaches and blurred vision.  She was not seen by ophthalmology as advised. She is currently being worked up to undergo bariatric surgery, current BMI 70.64  Completed HST 5/3 which confirms presence of REM sleep dependent OSA and recommended initiating CPAP.  Insurance would not approve in lab titration study therefore AutoPap initiated on 6/14.   Reports difficulty tolerating oral CPAP mask, can come off in the middle of the night while sleeping and at times have issues with leaks/keeping on when laying on her side. She attempted to mask refitting or finding a new mask but was told she had to wait the full 90 days prior to trialing a different mask.  She is otherwise tolerating well and plans to continue CPAP therapy.  Epworth Sleepiness Scale 14/24 (prior to CPAP 12/24).  Fatigue  severity scale 30/63 (prior to CPAP 63/63).  (See CPAP compliance download in 02/28/2022 note)    Consult visit 06/30/2021 Dr. Albertina Hugger: Glenda Kim is a 23 y.o. American female patient and seen on 06/30/2021 from Susanna Epley, FNP,  for a Sleep consultation.  Chief concern according to patient :   Glenda Kim moved here from think of a [place]  and was followed by [facility]  [facility]icine, she relocated to [place] and summer last year and lives here with her female partner she has been working as a Electrical engineer, currently no children, she has a past medical history of gastroparesis frequent vomiting latent nausea and endometriosis with PCOS, irregular menstrual cycle even with birth control pills. She achieved regular menstrual cycles for a while but now they are irregular again.  She is taking hydroxyzine 25 mg as needed rizatriptan for frequent migrainous headaches and she can take up to 2 a day of 10 mg.   She is currently not on sucralfate  but has been at the for a while Reglan  was given for a while for gastroparesis and she is no longer taking metformin .  Metformin  was initiated to control PCOS effect.  Her family practice ordered a sleep study the interpreting physician was Dr. Loletta Ripple school, MD study date was Feb 2nd, 2022 , she was tested by home sleep test and the test showed 16 apneas 7 mixed, 6 obstructive, 3 centrals and 22 hypopneas.  The AHI for hour of sleep was presumed to be 3.6/h.  Oxygen saturation remained stable longest time and oxygen desaturation was only 3/32 total time of 1.7 minutes.  Mean heart rate 77 bpm, so they did not find a significant amount of apnea.  Looking at the device that was used on this is not specified here I could see that the home sleep test device could not distinguish between REM and non-REM sleep and I wonder if the home sleep test could distinguish between sleep and just recording time.  The patient states that usually she gets just 4 to 6 hours of sleep and that she doubts the validity of the study for that reason.  We will definitely have to evaluate her with a different equipment.   Glenda Kim  has a past medical history of Anxiety, Cyclic vomiting syndrome, Depression, Endometriosis, Gastroparesis, PCOS with insulin restistance, Super obesity, and GERD (gastroesophageal reflux disease). Labs reviewed. 02-2021     Sleep  relevant medical history: Daily headaches- weekends and work days, any time, cut down coffee - no changes. Pressure behind the eye, stabbing pain behind the eye. Sharp, nausea. Dizziness, Vertigo.  Cluster , waking her up out of sleep , she feels hot and becomes diaphoretic. Nocturia 2 -3 times - fragmented sleep.  Long periods of wakefulness at night. Sleep talking Startling dreams 1/ month, hypothyroidism. Not diabetic.  Allergic seasonal  rhinitis.   Family medical /sleep history: brother and biological mother CPAP with OSA. She was raised by a paternal aunt.    Social history:  Patient is working as shiftSocial worker ,7 Am to 7 Pm , 5 days a week.  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   and lives in a household with GF and a dog.  Tobacco use: none .  ETOH use less than 1/ week,  Caffeine intake in form of Coffee( 1 day) . Regular exercise in form of walking, patrol.  Hobbies : barber      Sleep habits are as follows:  The patient's dinner time is between variable times- snack often in PM. The patient goes to bed at 10.30 PM, but is not asleep until midnight. Cool and quiet bedroom.   After midnight she continues to sleep for 1-2 hours, wakes for unknown reasons, may go to  bathroom.  She may get 4-6 hours total.   The preferred sleep position is sideways, left side-, with the support of 2 pillows.  Dreams are reportedly frequent/vivid.  6.15 AM is the usual rise time. The patient wakes up with an alarm. At 5.40 AM   She reports not feeling refreshed or restored in AM, with symptoms such as dry mouth, epistaxis, morning headaches, and residual fatigue.  Naps are taken infrequently, her work does not permit breaks, she feels less fatigued after 1 PM. She eats at 11.30-12 noon, but is not tired!  Naps on weekends lasting from 1-3 hours minutes and are more refreshing than nocturnal sleep. They affect night time sleep.     ROS:   14 system review of systems performed and  negative with exception of those listed in HPI  PMH:  Past Medical History:  Diagnosis Date   Anxiety    Cyclic vomiting syndrome    Depression    Endometriosis    Gastroparesis    GERD (gastroesophageal reflux disease)    Morbid obesity (HCC)     PSH:  Past Surgical History:  Procedure Laterality Date   CHOLECYSTECTOMY      Social History:  Social History   Socioeconomic History   Marital status: Single    Spouse name: Not on file   Number of children: 0   Years of education: Not on file   Highest education level: High school graduate  Occupational History   Not on file  Tobacco Use   Smoking status: Former    Types: E-cigarettes   Smokeless tobacco: Never  Vaping Use   Vaping status: Former  Substance and Sexual Activity   Alcohol use: Not Currently    Comment: socially   Drug use: Not Currently   Sexual activity: Yes    Partners: Female    Birth control/protection: None  Other Topics Concern   Not on file  Social History Narrative   Lives with partner   Right handed   Caffeine: stopped drinking coffee begging of year, drinks decaf tea and juice   Social Drivers of Health   Financial Resource Strain: Patient Unable To Answer (09/04/2022)   Received from [facility], [facility]   Overall Financial Resource Strain (CARDIA)    Difficulty of Paying Living Expenses: Patient unable to answer  Recent Concern: Financial Resource Strain - Medium Risk (07/20/2022)   Received from [facility]   Overall Financial Resource Strain (CARDIA)    Difficulty of Paying Living Expenses: Somewhat hard  Food Insecurity: No Food Insecurity (02/09/2023)   Hunger Vital Sign    Worried About Running Out of Food in the Last Year: Never true    Ran Out of Food in the Last Year: Never true  Transportation Needs: No Transportation Needs (02/09/2023)   PRAPARE - Administrator, Civil Service (Medical): No    Lack of Transportation (Non-Medical): No  Physical  Activity: Insufficiently Active (09/04/2022)   Received from [facility], [facility]   Exercise Vital Sign    Days of Exercise per Week: 3 days    Minutes of Exercise per Session: 30 min  Stress: No Stress Concern  Present (09/04/2022)   Received from [facility], [facility]   [facility] of Occupational Health - Occupational Stress Questionnaire    Feeling of Stress : Not at all  Recent Concern: Stress - Stress Concern Present (07/20/2022)   Received from [facility] of Occupational Health - Occupational Stress Questionnaire    Feeling of Stress : Very much  Social Connections: Socially Isolated (09/04/2022)   Received from [facility], [facility]   Social Connection and Isolation Panel [NHANES]    Frequency of Communication with Friends and Family: Patient unable to answer    Frequency of Social Gatherings with Friends and Family: Patient unable to answer    Attends Religious Services: Never    Database administrator or Organizations: No    Attends Banker Meetings: Never    Marital Status: Never married  Intimate Partner Violence: Not At Risk (02/09/2023)   Humiliation, Afraid, Rape, and Kick questionnaire    Fear of Current or Ex-Partner: No    Emotionally Abused: No    Physically Abused: No    Sexually Abused: No    Family History:  Family History  Problem Relation Age of Onset   Depression Mother    Anxiety disorder Mother    Heart disease Father    Cancer Father    Healthy Sister    Depression Brother    Anxiety disorder Brother    Irritable bowel syndrome Maternal Grandmother    Crohn's disease Maternal Grandmother    Cancer Maternal Grandfather    Hypertension Paternal Grandmother     Medications:   No current outpatient medications on file prior to visit.   No current facility-administered medications on file prior to visit.    Allergies:   Allergies  Allergen Reactions   Shellfish Allergy  Anaphylaxis   Bee Pollen Swelling    Pollen General         OBJECTIVE:  Physical Exam  Vitals:   10/31/23 0924  BP: 121/69  Pulse: (!) 59  Weight: 281 lb (127.5 kg)  Height: 5\' 7"  (1.702 m)   Body mass index is 44.01 kg/m. (Prior BMI 70.64!!) No results found.  General: Morbidly obese very pleasant young female, seated, in no evident distress  Neurologic Exam Mental Status: Awake and fully alert. Oriented to place and time. Recent and remote memory intact. Attention span, concentration and fund of knowledge appropriate. Mood and affect appropriate.  Cranial Nerves: Pupils equal, briskly reactive to light. Extraocular movements full without nystagmus. Visual fields full to confrontation. Hearing intact. Facial sensation intact. Face, tongue, palate moves normally and symmetrically.  Motor: Normal bulk and tone. Normal strength in all tested extremity muscles Sensory.: intact to touch , pinprick , position and vibratory sensation.  Coordination: Rapid alternating movements normal in all extremities. Finger-to-nose and heel-to-shin performed accurately bilaterally. Gait and Station: Arises from chair without difficulty. Stance is normal. Gait demonstrates normal stride length and balance without use of AD. Tandem walk and heel toe without difficulty.  Reflexes: 1+ and symmetric. Toes downgoing.         ASSESSMENT/PLAN: Glenda Kim is a 23 y.o. year old adult .      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  EEG and MRI negative Recurrent event 01/2023 with EEG capturing typical event with no ictal correlation meaning nonepileptic event Referral placed again to [facility]  as she does continue to struggle with depression/anxiety and history of PTSD- advised to follow up if she does not receive a call  Advised to call any recurrent events   Hx of IIH: No recent headaches,  vision changes or tinnitus She does complain of increased light sensitivity, will refer to ophthalmology to establish care for ongoing monitoring of recurrence of IIH and further evaluation of photophobia  Hx of OSA: Previously diagnosed with mild OSA in 2023, intolerant to CPAP She has since lost about 150 pounds post bariatric surgery which is excellent! No current sleep-related complaints, apnea possibly resolved with weight loss     No further recommendations from neurological standpoint and can return back to PCP.  She was advised to call with any questions or concerns in the future   CC:  PCP: Susanna Epley, FNP    I spent 30 minutes of face-to-face and non-face-to-face time with patient.  This included previsit chart review, lab review, study review, order entry, electronic health record documentation, patient education and discussion regarding above diagnoses and treatment plan and answered all other questions to patient's satisfaction  Johny Nap, [facility]  [facility] [address] [place], [place] [address]  Phone [phone] Fax [phone] Note: This document was prepared with digital dictation and possible smart phrase technology. Any transcriptional errors that result from this process are unintentional.

## 2023-10-31 ENCOUNTER — Encounter: Payer: Self-pay | Admitting: Adult Health

## 2023-10-31 ENCOUNTER — Telehealth: Payer: Self-pay | Admitting: Neurology

## 2023-10-31 ENCOUNTER — Telehealth: Payer: Self-pay | Admitting: Adult Health

## 2023-10-31 ENCOUNTER — Ambulatory Visit: Admitting: Adult Health

## 2023-10-31 VITALS — BP 121/69 | HR 59 | Ht 67.0 in | Wt 281.0 lb

## 2023-10-31 DIAGNOSIS — F418 Other specified anxiety disorders: Secondary | ICD-10-CM

## 2023-10-31 DIAGNOSIS — G932 Benign intracranial hypertension: Secondary | ICD-10-CM

## 2023-10-31 DIAGNOSIS — F445 Conversion disorder with seizures or convulsions: Secondary | ICD-10-CM

## 2023-10-31 NOTE — Telephone Encounter (Signed)
 Referral for ophthalmology fax to [facility]. Phone: [phone], Fax: [phone]

## 2023-10-31 NOTE — Telephone Encounter (Signed)
 Referral for behavioral health fax to [facility]. Phone: [phone], Fax: [phone]

## 2023-10-31 NOTE — Telephone Encounter (Signed)
 Pt states she will be arriving late for her 9:15 appointment.  Pt was informed there is a 5 min grace period, and that if she arrives out of that she will likely told to r/s.

## 2023-10-31 NOTE — Patient Instructions (Addendum)
 Your Plan:  Referral placed to behavorial health - if you do not hear from them in the next 2-3 weeks, please let us  know  Referral placed to ophthalmology to establish care    Follow up as needed at this time      Thank you for coming to see us  at [facility]. I hope we have been able to provide you high quality care today.  You may receive a patient satisfaction survey over the next few weeks. We would appreciate your feedback and comments so that we may continue to improve ourselves and the health of our patients.
